# Patient Record
Sex: Female | Born: 1955
Health system: Southern US, Community
[De-identification: ages and names within clinical notes are randomized; demographics above are authoritative.]

## PROBLEM LIST (undated history)

## (undated) HISTORY — PX: CARPAL TUNNEL RELEASE: SHX101

## (undated) HISTORY — PX: OTHER SURGICAL HISTORY: SHX169

## (undated) HISTORY — PX: FRACTURE SURGERY: SHX138

---

## 1999-02-14 ENCOUNTER — Other Ambulatory Visit: Admission: RE | Admit: 1999-02-14 | Discharge: 1999-02-14 | Payer: Self-pay | Admitting: *Deleted

## 2000-03-26 ENCOUNTER — Other Ambulatory Visit: Admission: RE | Admit: 2000-03-26 | Discharge: 2000-03-26 | Payer: Self-pay | Admitting: *Deleted

## 2003-04-26 ENCOUNTER — Other Ambulatory Visit: Admission: RE | Admit: 2003-04-26 | Discharge: 2003-04-26 | Payer: Self-pay | Admitting: *Deleted

## 2006-10-08 ENCOUNTER — Other Ambulatory Visit: Admission: RE | Admit: 2006-10-08 | Discharge: 2006-10-08 | Payer: Self-pay | Admitting: Family Medicine

## 2008-04-07 ENCOUNTER — Other Ambulatory Visit: Admission: RE | Admit: 2008-04-07 | Discharge: 2008-04-07 | Payer: Self-pay | Admitting: Family Medicine

## 2008-05-11 ENCOUNTER — Ambulatory Visit (HOSPITAL_BASED_OUTPATIENT_CLINIC_OR_DEPARTMENT_OTHER): Admission: RE | Admit: 2008-05-11 | Discharge: 2008-05-11 | Payer: Self-pay | Admitting: Orthopedic Surgery

## 2008-05-29 ENCOUNTER — Encounter: Admission: RE | Admit: 2008-05-29 | Discharge: 2008-05-29 | Payer: Self-pay | Admitting: Family Medicine

## 2008-08-08 ENCOUNTER — Ambulatory Visit (HOSPITAL_BASED_OUTPATIENT_CLINIC_OR_DEPARTMENT_OTHER): Admission: RE | Admit: 2008-08-08 | Discharge: 2008-08-08 | Payer: Self-pay | Admitting: Orthopedic Surgery

## 2009-08-22 ENCOUNTER — Encounter: Admission: RE | Admit: 2009-08-22 | Discharge: 2009-08-22 | Payer: Self-pay | Admitting: General Surgery

## 2009-09-21 ENCOUNTER — Other Ambulatory Visit: Admission: RE | Admit: 2009-09-21 | Discharge: 2009-09-21 | Payer: Self-pay | Admitting: Family Medicine

## 2009-10-04 ENCOUNTER — Encounter: Admission: RE | Admit: 2009-10-04 | Discharge: 2009-10-04 | Payer: Self-pay | Admitting: Family Medicine

## 2010-07-23 NOTE — Op Note (Signed)
NAME:  Tonya Schneider, Tonya Schneider NO.:  [id number]   MEDICAL RECORD NO.:  [id number]          PATIENT TYPE:  AMB   LOCATION:  DSC                          FACILITY:  [facility]   PHYSICIAN:  Cindee Salt, M.D.       DATE OF BIRTH:  1955/07/31   DATE OF PROCEDURE:  08/08/2008  DATE OF DISCHARGE:                               OPERATIVE REPORT   PREOPERATIVE DIAGNOSIS:  Carpal tunnel syndrome, right hand.   POSTOPERATIVE DIAGNOSIS:  Carpal tunnel syndrome, right hand.   OPERATION:  Decompression, right median nerve.   SURGEON:  Cindee Salt, MD   ASSISTANT:  Joaquin Courts, RN   ANESTHESIA:  Forearm-based IV regional.   ANESTHESIOLOGIST:  Janetta Hora. Frederick, MD   HISTORY:  The patient is a 55 year old female with a history of carpal  tunnel syndrome, EMG nerve conductions positive, which has not responded  to conservative treatment.  She has elected to undergo surgical  decompression.  Pre, peri, and postoperative course have been discussed  along with the risks and complications.  She is aware that there is no  guarantee with the surgery; possibility of infection; recurrence of  injury to arteries, nerves, tendons, incomplete relief of symptoms; and  dystrophy.  In the preoperative area, the patient is seen.  The  extremity marked by both the patient and surgeon.  Antibiotic given.   PROCEDURE:  The patient was brought to the operating room where a  forearm-based IV regional anesthetic was carried out without difficulty  under the direction of Dr. Gelene Mink.  She was prepped using ChloraPrep,  supine position, right arm free.  A 3-minute dry time was allowed.  A  time-out taken confirming the patient and procedure.  The arm was then  draped.  A longitudinal incision was made in the palm and carried down  through the subcutaneous tissue.  Bleeders were electrocauterized with  bipolar.  Dissection was carried down to the carpal retinaculum.  The  flexor tendon of the ring little  finger identified.  Retractor was  placed to the ulnar side of the median nerve.  The carpal retinaculum  was incised with sharp dissection.  Right angle and Sewall retractor  were placed between skin and forearm fascia.  The fascia was released  for approximately a centimeter and half proximal to the wrist crease  under direct vision.  Canal was explored.  The area of compression to  the nerve was apparent.  No further lesions were identified.  The wound  was irrigated.  Skin was then closed with interrupted 5-0 Vicryl Rapide  sutures.  A local infiltration was given.  Marcaine 0.25% without  epinephrine 3.5 mL was used.  A sterile compressive dressing and  splint to the wrist was applied with fingers free.  The patient  tolerated the procedure well and was taken to the recovery room for  observation in satisfactory condition.  On deflation of the tourniquet,  all fingers immediately pinked.  She will be discharged home to return  to the [facility] [place] in 1 week, on Vicodin.  ______________________________  Cindee Salt, M.D.     GK/MEDQ  D:  08/08/2008  T:  08/08/2008  Job:  [id number]

## 2010-07-23 NOTE — Op Note (Signed)
NAME:  Tonya Schneider, Tonya Schneider           ACCOUNT NO.:  [id number]   MEDICAL RECORD NO.:  [id number]          PATIENT TYPE:  AMB   LOCATION:  DSC                          FACILITY:  [facility]   PHYSICIAN:  Cindee Salt, M.D.       DATE OF BIRTH:  01/17/1956   DATE OF PROCEDURE:  05/11/2008  DATE OF DISCHARGE:                               OPERATIVE REPORT   PREOPERATIVE DIAGNOSIS:  Carpal tunnel syndrome, left hand.   POSTOPERATIVE DIAGNOSIS:  Carpal tunnel syndrome, left hand.   OPERATION:  Decompression, left median nerve.   SURGEON:  Cindee Salt, MD   ANESTHESIA:  Forearm based IV regional.   ANESTHESIOLOGIST:  Burna Forts, MD   HISTORY:  The patient is a 55 year old female with a history of carpal  tunnel syndrome, EMG nerve conductions positive.  She has undergone  release of her right side.  She is admitted now for release of her left.  She is aware of risks and complications including infection, recurrence  injury to arteries, nerves, tendons, incomplete relief of symptoms, or  dystrophy.  In the preoperative area, the patient is seen.  The  extremity marked by both the patient and surgeon.  Antibiotic given.   PROCEDURE:  The patient was brought to the operating room where forearm  based IV regional anesthetic was carried out without difficulty.  She  was prepped using DuraPrep in supine position, left arm free.  A time-  out was taken.  The longitudinal incision was made in the palm, carried  down through subcutaneous tissue.  Bleeders were electrocauterized,  palmar fascia was split, superficial palmar arch identified, the flexor  tendon to the ring little finger identified to the ulnar side of the  median nerve and carpal retinaculum was incised with sharp dissection.  A right-angle and Sewall retractor were placed between skin and forearm  fascia.  The fascia released for approximately a centimeter and a half  proximal to the wrist crease under direct vision.  Canal was  explored.  No further lesions were identified.  Area compression to the nerve was  apparent.  The wound was irrigated copiously with saline.  The skin  closed with interrupted 5-0 Vicryl Rapide sutures.  A local infiltration  was then given with 0.25% Marcaine without epinephrine 3 mL was used.  Sterile compressive dressing and splint was applied and deflation of the  tourniquet, all fingers immediately pinked.  She was taken to the  recovery room for observation in satisfactory condition.  She will be  discharged home.  To return to the [facility] [place] in 1 week, on  Vicodin.           ______________________________  Cindee Salt, M.D.     GK/MEDQ  D:  05/11/2008  T:  05/12/2008  Job:  [id number]

## 2010-10-09 ENCOUNTER — Other Ambulatory Visit: Payer: Self-pay | Admitting: Family Medicine

## 2010-10-09 DIAGNOSIS — Z1231 Encounter for screening mammogram for malignant neoplasm of breast: Secondary | ICD-10-CM

## 2010-10-17 ENCOUNTER — Ambulatory Visit
Admission: RE | Admit: 2010-10-17 | Discharge: 2010-10-17 | Disposition: A | Discharge status: Home / Self Care | Payer: 59 | Source: Ambulatory Visit | Attending: Family Medicine | Admitting: Family Medicine

## 2010-10-17 DIAGNOSIS — Z1231 Encounter for screening mammogram for malignant neoplasm of breast: Secondary | ICD-10-CM

## 2011-09-17 ENCOUNTER — Other Ambulatory Visit (HOSPITAL_COMMUNITY)
Admission: RE | Admit: 2011-09-17 | Discharge: 2011-09-17 | Disposition: A | Discharge status: Home / Self Care | Payer: 59 | Source: Ambulatory Visit | Attending: Family Medicine | Admitting: Family Medicine

## 2011-09-17 ENCOUNTER — Other Ambulatory Visit: Payer: Self-pay | Admitting: Family Medicine

## 2011-09-17 DIAGNOSIS — Z1159 Encounter for screening for other viral diseases: Secondary | ICD-10-CM | POA: Insufficient documentation

## 2011-09-17 DIAGNOSIS — Z124 Encounter for screening for malignant neoplasm of cervix: Secondary | ICD-10-CM | POA: Insufficient documentation

## 2011-10-13 ENCOUNTER — Other Ambulatory Visit: Payer: Self-pay | Admitting: Family Medicine

## 2011-10-13 DIAGNOSIS — Z1231 Encounter for screening mammogram for malignant neoplasm of breast: Secondary | ICD-10-CM

## 2011-10-28 ENCOUNTER — Other Ambulatory Visit: Payer: Self-pay | Admitting: Family Medicine

## 2011-10-28 ENCOUNTER — Ambulatory Visit
Admission: RE | Admit: 2011-10-28 | Discharge: 2011-10-28 | Disposition: A | Discharge status: Home / Self Care | Payer: 59 | Source: Ambulatory Visit | Attending: Family Medicine | Admitting: Family Medicine

## 2011-10-28 DIAGNOSIS — Z1231 Encounter for screening mammogram for malignant neoplasm of breast: Secondary | ICD-10-CM

## 2012-10-25 ENCOUNTER — Other Ambulatory Visit: Payer: Self-pay

## 2012-10-25 DIAGNOSIS — Z1231 Encounter for screening mammogram for malignant neoplasm of breast: Secondary | ICD-10-CM

## 2012-11-17 ENCOUNTER — Ambulatory Visit
Admission: RE | Admit: 2012-11-17 | Discharge: 2012-11-17 | Disposition: A | Discharge status: Home / Self Care | Payer: 59 | Source: Ambulatory Visit

## 2012-11-17 DIAGNOSIS — Z1231 Encounter for screening mammogram for malignant neoplasm of breast: Secondary | ICD-10-CM

## 2012-11-19 ENCOUNTER — Other Ambulatory Visit: Payer: Self-pay | Admitting: Family Medicine

## 2012-11-19 DIAGNOSIS — R928 Other abnormal and inconclusive findings on diagnostic imaging of breast: Secondary | ICD-10-CM

## 2012-12-02 ENCOUNTER — Ambulatory Visit
Admission: RE | Admit: 2012-12-02 | Discharge: 2012-12-02 | Disposition: A | Discharge status: Home / Self Care | Payer: 59 | Source: Ambulatory Visit | Attending: Family Medicine | Admitting: Family Medicine

## 2012-12-02 DIAGNOSIS — R928 Other abnormal and inconclusive findings on diagnostic imaging of breast: Secondary | ICD-10-CM

## 2013-11-10 ENCOUNTER — Other Ambulatory Visit: Payer: Self-pay | Admitting: Family Medicine

## 2013-11-10 DIAGNOSIS — M8580 Other specified disorders of bone density and structure, unspecified site: Secondary | ICD-10-CM

## 2013-11-10 DIAGNOSIS — Z1231 Encounter for screening mammogram for malignant neoplasm of breast: Secondary | ICD-10-CM

## 2013-11-30 ENCOUNTER — Ambulatory Visit
Admission: RE | Admit: 2013-11-30 | Discharge: 2013-11-30 | Disposition: A | Discharge status: Home / Self Care | Payer: 59 | Source: Ambulatory Visit | Attending: Family Medicine | Admitting: Family Medicine

## 2013-11-30 ENCOUNTER — Encounter (INDEPENDENT_AMBULATORY_CARE_PROVIDER_SITE_OTHER): Payer: Self-pay

## 2013-11-30 DIAGNOSIS — Z1231 Encounter for screening mammogram for malignant neoplasm of breast: Secondary | ICD-10-CM

## 2013-11-30 DIAGNOSIS — M8580 Other specified disorders of bone density and structure, unspecified site: Secondary | ICD-10-CM

## 2015-06-27 DIAGNOSIS — F321 Major depressive disorder, single episode, moderate: Secondary | ICD-10-CM | POA: Diagnosis not present

## 2015-07-25 DIAGNOSIS — F321 Major depressive disorder, single episode, moderate: Secondary | ICD-10-CM | POA: Diagnosis not present

## 2015-08-22 DIAGNOSIS — F321 Major depressive disorder, single episode, moderate: Secondary | ICD-10-CM | POA: Diagnosis not present

## 2015-10-03 DIAGNOSIS — F321 Major depressive disorder, single episode, moderate: Secondary | ICD-10-CM | POA: Diagnosis not present

## 2015-11-06 DIAGNOSIS — F321 Major depressive disorder, single episode, moderate: Secondary | ICD-10-CM | POA: Diagnosis not present

## 2015-11-16 ENCOUNTER — Other Ambulatory Visit: Payer: Self-pay | Admitting: Physician Assistant

## 2015-11-16 ENCOUNTER — Other Ambulatory Visit (HOSPITAL_COMMUNITY)
Admission: RE | Admit: 2015-11-16 | Discharge: 2015-11-16 | Disposition: A | Discharge status: Home / Self Care | Payer: BLUE CROSS/BLUE SHIELD | Source: Ambulatory Visit | Attending: Physician Assistant | Admitting: Physician Assistant

## 2015-11-16 DIAGNOSIS — E559 Vitamin D deficiency, unspecified: Secondary | ICD-10-CM | POA: Diagnosis not present

## 2015-11-16 DIAGNOSIS — Z1151 Encounter for screening for human papillomavirus (HPV): Secondary | ICD-10-CM | POA: Diagnosis not present

## 2015-11-16 DIAGNOSIS — Z0000 Encounter for general adult medical examination without abnormal findings: Secondary | ICD-10-CM | POA: Diagnosis not present

## 2015-11-16 DIAGNOSIS — Z124 Encounter for screening for malignant neoplasm of cervix: Secondary | ICD-10-CM | POA: Diagnosis not present

## 2015-11-16 DIAGNOSIS — Z23 Encounter for immunization: Secondary | ICD-10-CM | POA: Diagnosis not present

## 2015-11-17 ENCOUNTER — Other Ambulatory Visit: Payer: Self-pay | Admitting: Physician Assistant

## 2015-11-17 DIAGNOSIS — Z1231 Encounter for screening mammogram for malignant neoplasm of breast: Secondary | ICD-10-CM

## 2015-11-17 DIAGNOSIS — M810 Age-related osteoporosis without current pathological fracture: Secondary | ICD-10-CM

## 2015-11-20 LAB — CYTOLOGY - PAP

## 2015-12-03 ENCOUNTER — Ambulatory Visit
Admission: RE | Admit: 2015-12-03 | Discharge: 2015-12-03 | Disposition: A | Discharge status: Home / Self Care | Payer: BLUE CROSS/BLUE SHIELD | Source: Ambulatory Visit | Attending: Physician Assistant | Admitting: Physician Assistant

## 2015-12-03 ENCOUNTER — Encounter: Payer: Self-pay | Admitting: Radiology

## 2015-12-03 DIAGNOSIS — Z1231 Encounter for screening mammogram for malignant neoplasm of breast: Secondary | ICD-10-CM | POA: Diagnosis not present

## 2015-12-03 DIAGNOSIS — M810 Age-related osteoporosis without current pathological fracture: Secondary | ICD-10-CM

## 2015-12-03 DIAGNOSIS — Z780 Asymptomatic menopausal state: Secondary | ICD-10-CM | POA: Diagnosis not present

## 2015-12-12 DIAGNOSIS — F321 Major depressive disorder, single episode, moderate: Secondary | ICD-10-CM | POA: Diagnosis not present

## 2015-12-17 DIAGNOSIS — Z1211 Encounter for screening for malignant neoplasm of colon: Secondary | ICD-10-CM | POA: Diagnosis not present

## 2016-01-09 DIAGNOSIS — F321 Major depressive disorder, single episode, moderate: Secondary | ICD-10-CM | POA: Diagnosis not present

## 2016-01-29 DIAGNOSIS — K640 First degree hemorrhoids: Secondary | ICD-10-CM | POA: Diagnosis not present

## 2016-01-29 DIAGNOSIS — Z1211 Encounter for screening for malignant neoplasm of colon: Secondary | ICD-10-CM | POA: Diagnosis not present

## 2016-02-13 DIAGNOSIS — F321 Major depressive disorder, single episode, moderate: Secondary | ICD-10-CM | POA: Diagnosis not present

## 2016-03-19 DIAGNOSIS — F321 Major depressive disorder, single episode, moderate: Secondary | ICD-10-CM | POA: Diagnosis not present

## 2016-04-16 DIAGNOSIS — F321 Major depressive disorder, single episode, moderate: Secondary | ICD-10-CM | POA: Diagnosis not present

## 2016-05-21 DIAGNOSIS — F321 Major depressive disorder, single episode, moderate: Secondary | ICD-10-CM | POA: Diagnosis not present

## 2016-06-18 DIAGNOSIS — F321 Major depressive disorder, single episode, moderate: Secondary | ICD-10-CM | POA: Diagnosis not present

## 2016-07-23 DIAGNOSIS — F321 Major depressive disorder, single episode, moderate: Secondary | ICD-10-CM | POA: Diagnosis not present

## 2016-08-20 DIAGNOSIS — F321 Major depressive disorder, single episode, moderate: Secondary | ICD-10-CM | POA: Diagnosis not present

## 2016-09-24 DIAGNOSIS — F321 Major depressive disorder, single episode, moderate: Secondary | ICD-10-CM | POA: Diagnosis not present

## 2016-10-27 DIAGNOSIS — L03211 Cellulitis of face: Secondary | ICD-10-CM | POA: Diagnosis not present

## 2016-10-27 DIAGNOSIS — S0086XA Insect bite (nonvenomous) of other part of head, initial encounter: Secondary | ICD-10-CM | POA: Diagnosis not present

## 2016-10-29 DIAGNOSIS — F321 Major depressive disorder, single episode, moderate: Secondary | ICD-10-CM | POA: Diagnosis not present

## 2016-11-20 DIAGNOSIS — Z13220 Encounter for screening for lipoid disorders: Secondary | ICD-10-CM | POA: Diagnosis not present

## 2016-11-20 DIAGNOSIS — Z23 Encounter for immunization: Secondary | ICD-10-CM | POA: Diagnosis not present

## 2016-11-20 DIAGNOSIS — E559 Vitamin D deficiency, unspecified: Secondary | ICD-10-CM | POA: Diagnosis not present

## 2016-11-20 DIAGNOSIS — Z0000 Encounter for general adult medical examination without abnormal findings: Secondary | ICD-10-CM | POA: Diagnosis not present

## 2016-12-03 ENCOUNTER — Other Ambulatory Visit: Payer: Self-pay | Admitting: Family Medicine

## 2016-12-03 DIAGNOSIS — Z1231 Encounter for screening mammogram for malignant neoplasm of breast: Secondary | ICD-10-CM

## 2016-12-03 DIAGNOSIS — F321 Major depressive disorder, single episode, moderate: Secondary | ICD-10-CM | POA: Diagnosis not present

## 2016-12-19 ENCOUNTER — Ambulatory Visit
Admission: RE | Admit: 2016-12-19 | Discharge: 2016-12-19 | Disposition: A | Discharge status: Home / Self Care | Payer: BLUE CROSS/BLUE SHIELD | Source: Ambulatory Visit | Attending: Family Medicine | Admitting: Family Medicine

## 2016-12-19 DIAGNOSIS — Z1231 Encounter for screening mammogram for malignant neoplasm of breast: Secondary | ICD-10-CM

## 2016-12-31 DIAGNOSIS — F321 Major depressive disorder, single episode, moderate: Secondary | ICD-10-CM | POA: Diagnosis not present

## 2017-02-04 DIAGNOSIS — F321 Major depressive disorder, single episode, moderate: Secondary | ICD-10-CM | POA: Diagnosis not present

## 2017-03-11 DIAGNOSIS — F321 Major depressive disorder, single episode, moderate: Secondary | ICD-10-CM | POA: Diagnosis not present

## 2017-04-22 DIAGNOSIS — F321 Major depressive disorder, single episode, moderate: Secondary | ICD-10-CM | POA: Diagnosis not present

## 2017-04-27 DIAGNOSIS — Z808 Family history of malignant neoplasm of other organs or systems: Secondary | ICD-10-CM | POA: Diagnosis not present

## 2017-04-27 DIAGNOSIS — L821 Other seborrheic keratosis: Secondary | ICD-10-CM | POA: Diagnosis not present

## 2017-04-27 DIAGNOSIS — D1801 Hemangioma of skin and subcutaneous tissue: Secondary | ICD-10-CM | POA: Diagnosis not present

## 2017-05-20 DIAGNOSIS — F321 Major depressive disorder, single episode, moderate: Secondary | ICD-10-CM | POA: Diagnosis not present

## 2017-06-17 DIAGNOSIS — F321 Major depressive disorder, single episode, moderate: Secondary | ICD-10-CM | POA: Diagnosis not present

## 2017-07-15 DIAGNOSIS — F321 Major depressive disorder, single episode, moderate: Secondary | ICD-10-CM | POA: Diagnosis not present

## 2017-08-12 DIAGNOSIS — F321 Major depressive disorder, single episode, moderate: Secondary | ICD-10-CM | POA: Diagnosis not present

## 2017-09-16 DIAGNOSIS — F321 Major depressive disorder, single episode, moderate: Secondary | ICD-10-CM | POA: Diagnosis not present

## 2017-09-29 DIAGNOSIS — S299XXA Unspecified injury of thorax, initial encounter: Secondary | ICD-10-CM | POA: Diagnosis not present

## 2017-09-29 DIAGNOSIS — D72829 Elevated white blood cell count, unspecified: Secondary | ICD-10-CM | POA: Diagnosis not present

## 2017-09-29 DIAGNOSIS — S72011A Unspecified intracapsular fracture of right femur, initial encounter for closed fracture: Secondary | ICD-10-CM | POA: Diagnosis not present

## 2017-09-29 DIAGNOSIS — R269 Unspecified abnormalities of gait and mobility: Secondary | ICD-10-CM | POA: Diagnosis not present

## 2017-09-29 DIAGNOSIS — T148XXA Other injury of unspecified body region, initial encounter: Secondary | ICD-10-CM | POA: Diagnosis not present

## 2017-09-29 DIAGNOSIS — Z8249 Family history of ischemic heart disease and other diseases of the circulatory system: Secondary | ICD-10-CM | POA: Diagnosis not present

## 2017-09-29 DIAGNOSIS — Z9181 History of falling: Secondary | ICD-10-CM | POA: Diagnosis not present

## 2017-09-29 DIAGNOSIS — Z96642 Presence of left artificial hip joint: Secondary | ICD-10-CM | POA: Diagnosis not present

## 2017-09-29 DIAGNOSIS — M25551 Pain in right hip: Secondary | ICD-10-CM | POA: Diagnosis not present

## 2017-09-29 DIAGNOSIS — S72001A Fracture of unspecified part of neck of right femur, initial encounter for closed fracture: Secondary | ICD-10-CM | POA: Diagnosis not present

## 2017-09-30 DIAGNOSIS — S72011A Unspecified intracapsular fracture of right femur, initial encounter for closed fracture: Secondary | ICD-10-CM | POA: Diagnosis not present

## 2017-09-30 DIAGNOSIS — Z9181 History of falling: Secondary | ICD-10-CM | POA: Diagnosis not present

## 2017-09-30 DIAGNOSIS — S72001A Fracture of unspecified part of neck of right femur, initial encounter for closed fracture: Secondary | ICD-10-CM | POA: Diagnosis not present

## 2017-09-30 DIAGNOSIS — Z96642 Presence of left artificial hip joint: Secondary | ICD-10-CM | POA: Diagnosis not present

## 2017-09-30 DIAGNOSIS — D72829 Elevated white blood cell count, unspecified: Secondary | ICD-10-CM | POA: Diagnosis not present

## 2017-10-01 DIAGNOSIS — S72011A Unspecified intracapsular fracture of right femur, initial encounter for closed fracture: Secondary | ICD-10-CM | POA: Diagnosis not present

## 2017-10-01 DIAGNOSIS — D72829 Elevated white blood cell count, unspecified: Secondary | ICD-10-CM | POA: Diagnosis not present

## 2017-10-01 DIAGNOSIS — Z9181 History of falling: Secondary | ICD-10-CM | POA: Diagnosis not present

## 2017-10-01 DIAGNOSIS — S72001A Fracture of unspecified part of neck of right femur, initial encounter for closed fracture: Secondary | ICD-10-CM | POA: Diagnosis not present

## 2017-10-01 DIAGNOSIS — Z96642 Presence of left artificial hip joint: Secondary | ICD-10-CM | POA: Diagnosis not present

## 2017-10-02 DIAGNOSIS — Z96641 Presence of right artificial hip joint: Secondary | ICD-10-CM | POA: Diagnosis not present

## 2017-10-02 DIAGNOSIS — W19XXXA Unspecified fall, initial encounter: Secondary | ICD-10-CM | POA: Diagnosis not present

## 2017-10-02 DIAGNOSIS — D62 Acute posthemorrhagic anemia: Secondary | ICD-10-CM | POA: Diagnosis not present

## 2017-10-02 DIAGNOSIS — Z9181 History of falling: Secondary | ICD-10-CM | POA: Diagnosis not present

## 2017-10-02 DIAGNOSIS — D72829 Elevated white blood cell count, unspecified: Secondary | ICD-10-CM | POA: Diagnosis not present

## 2017-10-02 DIAGNOSIS — S72011D Unspecified intracapsular fracture of right femur, subsequent encounter for closed fracture with routine healing: Secondary | ICD-10-CM | POA: Diagnosis not present

## 2017-10-02 DIAGNOSIS — Z96642 Presence of left artificial hip joint: Secondary | ICD-10-CM | POA: Diagnosis not present

## 2017-10-02 DIAGNOSIS — Z471 Aftercare following joint replacement surgery: Secondary | ICD-10-CM | POA: Diagnosis not present

## 2017-10-02 DIAGNOSIS — D649 Anemia, unspecified: Secondary | ICD-10-CM | POA: Diagnosis not present

## 2017-10-02 DIAGNOSIS — S72001A Fracture of unspecified part of neck of right femur, initial encounter for closed fracture: Secondary | ICD-10-CM | POA: Diagnosis not present

## 2017-10-02 DIAGNOSIS — G4700 Insomnia, unspecified: Secondary | ICD-10-CM | POA: Diagnosis not present

## 2017-10-02 DIAGNOSIS — Z7409 Other reduced mobility: Secondary | ICD-10-CM | POA: Diagnosis not present

## 2017-10-02 DIAGNOSIS — R739 Hyperglycemia, unspecified: Secondary | ICD-10-CM | POA: Diagnosis not present

## 2017-10-02 DIAGNOSIS — Z7982 Long term (current) use of aspirin: Secondary | ICD-10-CM | POA: Diagnosis not present

## 2017-10-03 DIAGNOSIS — D72829 Elevated white blood cell count, unspecified: Secondary | ICD-10-CM | POA: Diagnosis not present

## 2017-10-03 DIAGNOSIS — Z7982 Long term (current) use of aspirin: Secondary | ICD-10-CM | POA: Diagnosis not present

## 2017-10-03 DIAGNOSIS — R739 Hyperglycemia, unspecified: Secondary | ICD-10-CM | POA: Diagnosis not present

## 2017-10-03 DIAGNOSIS — Z96642 Presence of left artificial hip joint: Secondary | ICD-10-CM | POA: Diagnosis not present

## 2017-10-03 DIAGNOSIS — Z7409 Other reduced mobility: Secondary | ICD-10-CM | POA: Diagnosis not present

## 2017-10-04 DIAGNOSIS — Z96642 Presence of left artificial hip joint: Secondary | ICD-10-CM | POA: Diagnosis not present

## 2017-10-04 DIAGNOSIS — Z7982 Long term (current) use of aspirin: Secondary | ICD-10-CM | POA: Diagnosis not present

## 2017-10-04 DIAGNOSIS — S72011D Unspecified intracapsular fracture of right femur, subsequent encounter for closed fracture with routine healing: Secondary | ICD-10-CM | POA: Diagnosis not present

## 2017-10-04 DIAGNOSIS — R739 Hyperglycemia, unspecified: Secondary | ICD-10-CM | POA: Diagnosis not present

## 2017-10-04 DIAGNOSIS — Z7409 Other reduced mobility: Secondary | ICD-10-CM | POA: Diagnosis not present

## 2017-10-05 DIAGNOSIS — D62 Acute posthemorrhagic anemia: Secondary | ICD-10-CM | POA: Diagnosis not present

## 2017-10-05 DIAGNOSIS — Z7409 Other reduced mobility: Secondary | ICD-10-CM | POA: Diagnosis not present

## 2017-10-05 DIAGNOSIS — G4700 Insomnia, unspecified: Secondary | ICD-10-CM | POA: Diagnosis not present

## 2017-10-05 DIAGNOSIS — Z96642 Presence of left artificial hip joint: Secondary | ICD-10-CM | POA: Diagnosis not present

## 2017-10-06 DIAGNOSIS — Z7409 Other reduced mobility: Secondary | ICD-10-CM | POA: Diagnosis not present

## 2017-10-06 DIAGNOSIS — Z96642 Presence of left artificial hip joint: Secondary | ICD-10-CM | POA: Diagnosis not present

## 2017-10-06 DIAGNOSIS — D62 Acute posthemorrhagic anemia: Secondary | ICD-10-CM | POA: Diagnosis not present

## 2017-10-06 DIAGNOSIS — G4700 Insomnia, unspecified: Secondary | ICD-10-CM | POA: Diagnosis not present

## 2017-10-07 DIAGNOSIS — Z96642 Presence of left artificial hip joint: Secondary | ICD-10-CM | POA: Diagnosis not present

## 2017-10-07 DIAGNOSIS — Z7409 Other reduced mobility: Secondary | ICD-10-CM | POA: Diagnosis not present

## 2017-10-20 DIAGNOSIS — M5136 Other intervertebral disc degeneration, lumbar region: Secondary | ICD-10-CM | POA: Diagnosis not present

## 2017-10-20 DIAGNOSIS — Z96641 Presence of right artificial hip joint: Secondary | ICD-10-CM | POA: Diagnosis not present

## 2017-10-20 DIAGNOSIS — Z471 Aftercare following joint replacement surgery: Secondary | ICD-10-CM | POA: Diagnosis not present

## 2017-10-20 DIAGNOSIS — M25551 Pain in right hip: Secondary | ICD-10-CM | POA: Diagnosis not present

## 2017-10-21 DIAGNOSIS — R29898 Other symptoms and signs involving the musculoskeletal system: Secondary | ICD-10-CM | POA: Diagnosis not present

## 2017-10-21 DIAGNOSIS — Z96642 Presence of left artificial hip joint: Secondary | ICD-10-CM | POA: Diagnosis not present

## 2017-10-21 DIAGNOSIS — R269 Unspecified abnormalities of gait and mobility: Secondary | ICD-10-CM | POA: Diagnosis not present

## 2017-11-10 DIAGNOSIS — Z96641 Presence of right artificial hip joint: Secondary | ICD-10-CM | POA: Diagnosis not present

## 2017-11-10 DIAGNOSIS — Z471 Aftercare following joint replacement surgery: Secondary | ICD-10-CM | POA: Diagnosis not present

## 2017-11-10 DIAGNOSIS — M5136 Other intervertebral disc degeneration, lumbar region: Secondary | ICD-10-CM | POA: Diagnosis not present

## 2017-11-12 DIAGNOSIS — R29898 Other symptoms and signs involving the musculoskeletal system: Secondary | ICD-10-CM | POA: Diagnosis not present

## 2017-11-12 DIAGNOSIS — M62551 Muscle wasting and atrophy, not elsewhere classified, right thigh: Secondary | ICD-10-CM | POA: Diagnosis not present

## 2017-11-12 DIAGNOSIS — R2689 Other abnormalities of gait and mobility: Secondary | ICD-10-CM | POA: Diagnosis not present

## 2017-11-12 DIAGNOSIS — Z4789 Encounter for other orthopedic aftercare: Secondary | ICD-10-CM | POA: Diagnosis not present

## 2017-11-19 DIAGNOSIS — F321 Major depressive disorder, single episode, moderate: Secondary | ICD-10-CM | POA: Diagnosis not present

## 2017-11-20 DIAGNOSIS — Z4789 Encounter for other orthopedic aftercare: Secondary | ICD-10-CM | POA: Diagnosis not present

## 2017-11-20 DIAGNOSIS — R29898 Other symptoms and signs involving the musculoskeletal system: Secondary | ICD-10-CM | POA: Diagnosis not present

## 2017-11-20 DIAGNOSIS — M62551 Muscle wasting and atrophy, not elsewhere classified, right thigh: Secondary | ICD-10-CM | POA: Diagnosis not present

## 2017-11-20 DIAGNOSIS — R2689 Other abnormalities of gait and mobility: Secondary | ICD-10-CM | POA: Diagnosis not present

## 2017-11-26 DIAGNOSIS — R29898 Other symptoms and signs involving the musculoskeletal system: Secondary | ICD-10-CM | POA: Diagnosis not present

## 2017-11-26 DIAGNOSIS — R2689 Other abnormalities of gait and mobility: Secondary | ICD-10-CM | POA: Diagnosis not present

## 2017-11-26 DIAGNOSIS — M62551 Muscle wasting and atrophy, not elsewhere classified, right thigh: Secondary | ICD-10-CM | POA: Diagnosis not present

## 2017-11-26 DIAGNOSIS — Z4789 Encounter for other orthopedic aftercare: Secondary | ICD-10-CM | POA: Diagnosis not present

## 2017-12-03 DIAGNOSIS — M62551 Muscle wasting and atrophy, not elsewhere classified, right thigh: Secondary | ICD-10-CM | POA: Diagnosis not present

## 2017-12-03 DIAGNOSIS — Z4789 Encounter for other orthopedic aftercare: Secondary | ICD-10-CM | POA: Diagnosis not present

## 2017-12-03 DIAGNOSIS — R29898 Other symptoms and signs involving the musculoskeletal system: Secondary | ICD-10-CM | POA: Diagnosis not present

## 2017-12-03 DIAGNOSIS — R2689 Other abnormalities of gait and mobility: Secondary | ICD-10-CM | POA: Diagnosis not present

## 2017-12-07 DIAGNOSIS — H5213 Myopia, bilateral: Secondary | ICD-10-CM | POA: Diagnosis not present

## 2017-12-10 DIAGNOSIS — Z4789 Encounter for other orthopedic aftercare: Secondary | ICD-10-CM | POA: Diagnosis not present

## 2017-12-10 DIAGNOSIS — R2689 Other abnormalities of gait and mobility: Secondary | ICD-10-CM | POA: Diagnosis not present

## 2017-12-10 DIAGNOSIS — M62551 Muscle wasting and atrophy, not elsewhere classified, right thigh: Secondary | ICD-10-CM | POA: Diagnosis not present

## 2017-12-10 DIAGNOSIS — R29898 Other symptoms and signs involving the musculoskeletal system: Secondary | ICD-10-CM | POA: Diagnosis not present

## 2017-12-10 DIAGNOSIS — Z96642 Presence of left artificial hip joint: Secondary | ICD-10-CM | POA: Diagnosis not present

## 2017-12-14 DIAGNOSIS — Z0000 Encounter for general adult medical examination without abnormal findings: Secondary | ICD-10-CM | POA: Diagnosis not present

## 2017-12-14 DIAGNOSIS — Z13220 Encounter for screening for lipoid disorders: Secondary | ICD-10-CM | POA: Diagnosis not present

## 2017-12-14 DIAGNOSIS — Z1159 Encounter for screening for other viral diseases: Secondary | ICD-10-CM | POA: Diagnosis not present

## 2017-12-14 DIAGNOSIS — Z23 Encounter for immunization: Secondary | ICD-10-CM | POA: Diagnosis not present

## 2017-12-14 DIAGNOSIS — Z130 Encounter for screening for diseases of the blood and blood-forming organs and certain disorders involving the immune mechanism: Secondary | ICD-10-CM | POA: Diagnosis not present

## 2017-12-14 DIAGNOSIS — Z1211 Encounter for screening for malignant neoplasm of colon: Secondary | ICD-10-CM | POA: Diagnosis not present

## 2017-12-14 DIAGNOSIS — R7301 Impaired fasting glucose: Secondary | ICD-10-CM | POA: Diagnosis not present

## 2017-12-14 DIAGNOSIS — E559 Vitamin D deficiency, unspecified: Secondary | ICD-10-CM | POA: Diagnosis not present

## 2017-12-15 ENCOUNTER — Other Ambulatory Visit: Payer: Self-pay | Admitting: Family Medicine

## 2017-12-15 DIAGNOSIS — M810 Age-related osteoporosis without current pathological fracture: Secondary | ICD-10-CM

## 2017-12-16 ENCOUNTER — Other Ambulatory Visit: Payer: Self-pay | Admitting: Family Medicine

## 2017-12-16 DIAGNOSIS — Z1231 Encounter for screening mammogram for malignant neoplasm of breast: Secondary | ICD-10-CM

## 2017-12-17 DIAGNOSIS — Z96642 Presence of left artificial hip joint: Secondary | ICD-10-CM | POA: Diagnosis not present

## 2017-12-17 DIAGNOSIS — Z4789 Encounter for other orthopedic aftercare: Secondary | ICD-10-CM | POA: Diagnosis not present

## 2017-12-17 DIAGNOSIS — M62551 Muscle wasting and atrophy, not elsewhere classified, right thigh: Secondary | ICD-10-CM | POA: Diagnosis not present

## 2017-12-17 DIAGNOSIS — R29898 Other symptoms and signs involving the musculoskeletal system: Secondary | ICD-10-CM | POA: Diagnosis not present

## 2017-12-17 DIAGNOSIS — R2689 Other abnormalities of gait and mobility: Secondary | ICD-10-CM | POA: Diagnosis not present

## 2017-12-23 DIAGNOSIS — F321 Major depressive disorder, single episode, moderate: Secondary | ICD-10-CM | POA: Diagnosis not present

## 2017-12-24 DIAGNOSIS — R2689 Other abnormalities of gait and mobility: Secondary | ICD-10-CM | POA: Diagnosis not present

## 2017-12-24 DIAGNOSIS — Z4789 Encounter for other orthopedic aftercare: Secondary | ICD-10-CM | POA: Diagnosis not present

## 2017-12-24 DIAGNOSIS — M62551 Muscle wasting and atrophy, not elsewhere classified, right thigh: Secondary | ICD-10-CM | POA: Diagnosis not present

## 2017-12-24 DIAGNOSIS — R29898 Other symptoms and signs involving the musculoskeletal system: Secondary | ICD-10-CM | POA: Diagnosis not present

## 2017-12-24 DIAGNOSIS — Z96642 Presence of left artificial hip joint: Secondary | ICD-10-CM | POA: Diagnosis not present

## 2017-12-31 DIAGNOSIS — R29898 Other symptoms and signs involving the musculoskeletal system: Secondary | ICD-10-CM | POA: Diagnosis not present

## 2017-12-31 DIAGNOSIS — R2689 Other abnormalities of gait and mobility: Secondary | ICD-10-CM | POA: Diagnosis not present

## 2017-12-31 DIAGNOSIS — Z96642 Presence of left artificial hip joint: Secondary | ICD-10-CM | POA: Diagnosis not present

## 2017-12-31 DIAGNOSIS — Z4789 Encounter for other orthopedic aftercare: Secondary | ICD-10-CM | POA: Diagnosis not present

## 2017-12-31 DIAGNOSIS — M62551 Muscle wasting and atrophy, not elsewhere classified, right thigh: Secondary | ICD-10-CM | POA: Diagnosis not present

## 2018-01-07 DIAGNOSIS — R2689 Other abnormalities of gait and mobility: Secondary | ICD-10-CM | POA: Diagnosis not present

## 2018-01-07 DIAGNOSIS — R29898 Other symptoms and signs involving the musculoskeletal system: Secondary | ICD-10-CM | POA: Diagnosis not present

## 2018-01-07 DIAGNOSIS — Z4789 Encounter for other orthopedic aftercare: Secondary | ICD-10-CM | POA: Diagnosis not present

## 2018-01-07 DIAGNOSIS — M62551 Muscle wasting and atrophy, not elsewhere classified, right thigh: Secondary | ICD-10-CM | POA: Diagnosis not present

## 2018-01-07 DIAGNOSIS — Z96642 Presence of left artificial hip joint: Secondary | ICD-10-CM | POA: Diagnosis not present

## 2018-01-14 DIAGNOSIS — Z96642 Presence of left artificial hip joint: Secondary | ICD-10-CM | POA: Diagnosis not present

## 2018-01-14 DIAGNOSIS — R29898 Other symptoms and signs involving the musculoskeletal system: Secondary | ICD-10-CM | POA: Diagnosis not present

## 2018-01-14 DIAGNOSIS — Z4789 Encounter for other orthopedic aftercare: Secondary | ICD-10-CM | POA: Diagnosis not present

## 2018-01-14 DIAGNOSIS — M62551 Muscle wasting and atrophy, not elsewhere classified, right thigh: Secondary | ICD-10-CM | POA: Diagnosis not present

## 2018-01-14 DIAGNOSIS — R2689 Other abnormalities of gait and mobility: Secondary | ICD-10-CM | POA: Diagnosis not present

## 2018-01-20 DIAGNOSIS — F321 Major depressive disorder, single episode, moderate: Secondary | ICD-10-CM | POA: Diagnosis not present

## 2018-01-21 DIAGNOSIS — R29898 Other symptoms and signs involving the musculoskeletal system: Secondary | ICD-10-CM | POA: Diagnosis not present

## 2018-01-21 DIAGNOSIS — R2689 Other abnormalities of gait and mobility: Secondary | ICD-10-CM | POA: Diagnosis not present

## 2018-01-21 DIAGNOSIS — Z4789 Encounter for other orthopedic aftercare: Secondary | ICD-10-CM | POA: Diagnosis not present

## 2018-01-21 DIAGNOSIS — Z96642 Presence of left artificial hip joint: Secondary | ICD-10-CM | POA: Diagnosis not present

## 2018-01-21 DIAGNOSIS — M62551 Muscle wasting and atrophy, not elsewhere classified, right thigh: Secondary | ICD-10-CM | POA: Diagnosis not present

## 2018-02-17 ENCOUNTER — Ambulatory Visit
Admission: RE | Admit: 2018-02-17 | Discharge: 2018-02-17 | Disposition: A | Discharge status: Home / Self Care | Payer: BLUE CROSS/BLUE SHIELD | Source: Ambulatory Visit | Attending: Family Medicine | Admitting: Family Medicine

## 2018-02-17 DIAGNOSIS — Z780 Asymptomatic menopausal state: Secondary | ICD-10-CM | POA: Diagnosis not present

## 2018-02-17 DIAGNOSIS — M810 Age-related osteoporosis without current pathological fracture: Secondary | ICD-10-CM

## 2018-02-17 DIAGNOSIS — M85852 Other specified disorders of bone density and structure, left thigh: Secondary | ICD-10-CM | POA: Diagnosis not present

## 2018-02-17 DIAGNOSIS — Z1231 Encounter for screening mammogram for malignant neoplasm of breast: Secondary | ICD-10-CM | POA: Diagnosis not present

## 2018-02-17 DIAGNOSIS — F321 Major depressive disorder, single episode, moderate: Secondary | ICD-10-CM | POA: Diagnosis not present

## 2018-03-17 DIAGNOSIS — F321 Major depressive disorder, single episode, moderate: Secondary | ICD-10-CM | POA: Diagnosis not present

## 2018-04-14 DIAGNOSIS — F321 Major depressive disorder, single episode, moderate: Secondary | ICD-10-CM | POA: Diagnosis not present

## 2018-05-12 DIAGNOSIS — M654 Radial styloid tenosynovitis [de Quervain]: Secondary | ICD-10-CM | POA: Diagnosis not present

## 2018-05-12 DIAGNOSIS — M810 Age-related osteoporosis without current pathological fracture: Secondary | ICD-10-CM | POA: Diagnosis not present

## 2018-07-12 DIAGNOSIS — H43811 Vitreous degeneration, right eye: Secondary | ICD-10-CM | POA: Diagnosis not present

## 2018-07-12 DIAGNOSIS — H43391 Other vitreous opacities, right eye: Secondary | ICD-10-CM | POA: Diagnosis not present

## 2018-12-24 ENCOUNTER — Other Ambulatory Visit (HOSPITAL_COMMUNITY)
Admission: RE | Admit: 2018-12-24 | Discharge: 2018-12-24 | Disposition: A | Discharge status: Home / Self Care | Payer: Medicare HMO | Source: Ambulatory Visit | Attending: Family Medicine | Admitting: Family Medicine

## 2018-12-24 ENCOUNTER — Other Ambulatory Visit: Payer: Self-pay | Admitting: Family Medicine

## 2018-12-24 DIAGNOSIS — E559 Vitamin D deficiency, unspecified: Secondary | ICD-10-CM | POA: Diagnosis not present

## 2018-12-24 DIAGNOSIS — Z136 Encounter for screening for cardiovascular disorders: Secondary | ICD-10-CM | POA: Diagnosis not present

## 2018-12-24 DIAGNOSIS — Z13220 Encounter for screening for lipoid disorders: Secondary | ICD-10-CM | POA: Diagnosis not present

## 2018-12-24 DIAGNOSIS — R7301 Impaired fasting glucose: Secondary | ICD-10-CM | POA: Diagnosis not present

## 2018-12-24 DIAGNOSIS — M810 Age-related osteoporosis without current pathological fracture: Secondary | ICD-10-CM | POA: Diagnosis not present

## 2018-12-24 DIAGNOSIS — Z124 Encounter for screening for malignant neoplasm of cervix: Secondary | ICD-10-CM | POA: Insufficient documentation

## 2018-12-24 DIAGNOSIS — Z23 Encounter for immunization: Secondary | ICD-10-CM | POA: Diagnosis not present

## 2018-12-24 DIAGNOSIS — Z0000 Encounter for general adult medical examination without abnormal findings: Secondary | ICD-10-CM | POA: Diagnosis not present

## 2018-12-24 DIAGNOSIS — L821 Other seborrheic keratosis: Secondary | ICD-10-CM | POA: Diagnosis not present

## 2018-12-30 LAB — CYTOLOGY - PAP
Comment: NEGATIVE
Diagnosis: NEGATIVE
High risk HPV: NEGATIVE

## 2020-01-06 DIAGNOSIS — E559 Vitamin D deficiency, unspecified: Secondary | ICD-10-CM | POA: Diagnosis not present

## 2020-01-06 DIAGNOSIS — R7301 Impaired fasting glucose: Secondary | ICD-10-CM | POA: Diagnosis not present

## 2020-01-06 DIAGNOSIS — Z23 Encounter for immunization: Secondary | ICD-10-CM | POA: Diagnosis not present

## 2020-01-06 DIAGNOSIS — Z136 Encounter for screening for cardiovascular disorders: Secondary | ICD-10-CM | POA: Diagnosis not present

## 2020-01-06 DIAGNOSIS — M810 Age-related osteoporosis without current pathological fracture: Secondary | ICD-10-CM | POA: Diagnosis not present

## 2020-01-06 DIAGNOSIS — Z13220 Encounter for screening for lipoid disorders: Secondary | ICD-10-CM | POA: Diagnosis not present

## 2020-01-06 DIAGNOSIS — Z0000 Encounter for general adult medical examination without abnormal findings: Secondary | ICD-10-CM | POA: Diagnosis not present

## 2020-01-10 IMAGING — MG DIGITAL SCREENING BILATERAL MAMMOGRAM WITH CAD
4 series · 4 of 4 positions shown · non-contrast
Comparison: Previous exam(s).

CLINICAL DATA: Screening.

EXAM:
DIGITAL SCREENING BILATERAL MAMMOGRAM WITH CAD

[L CC]
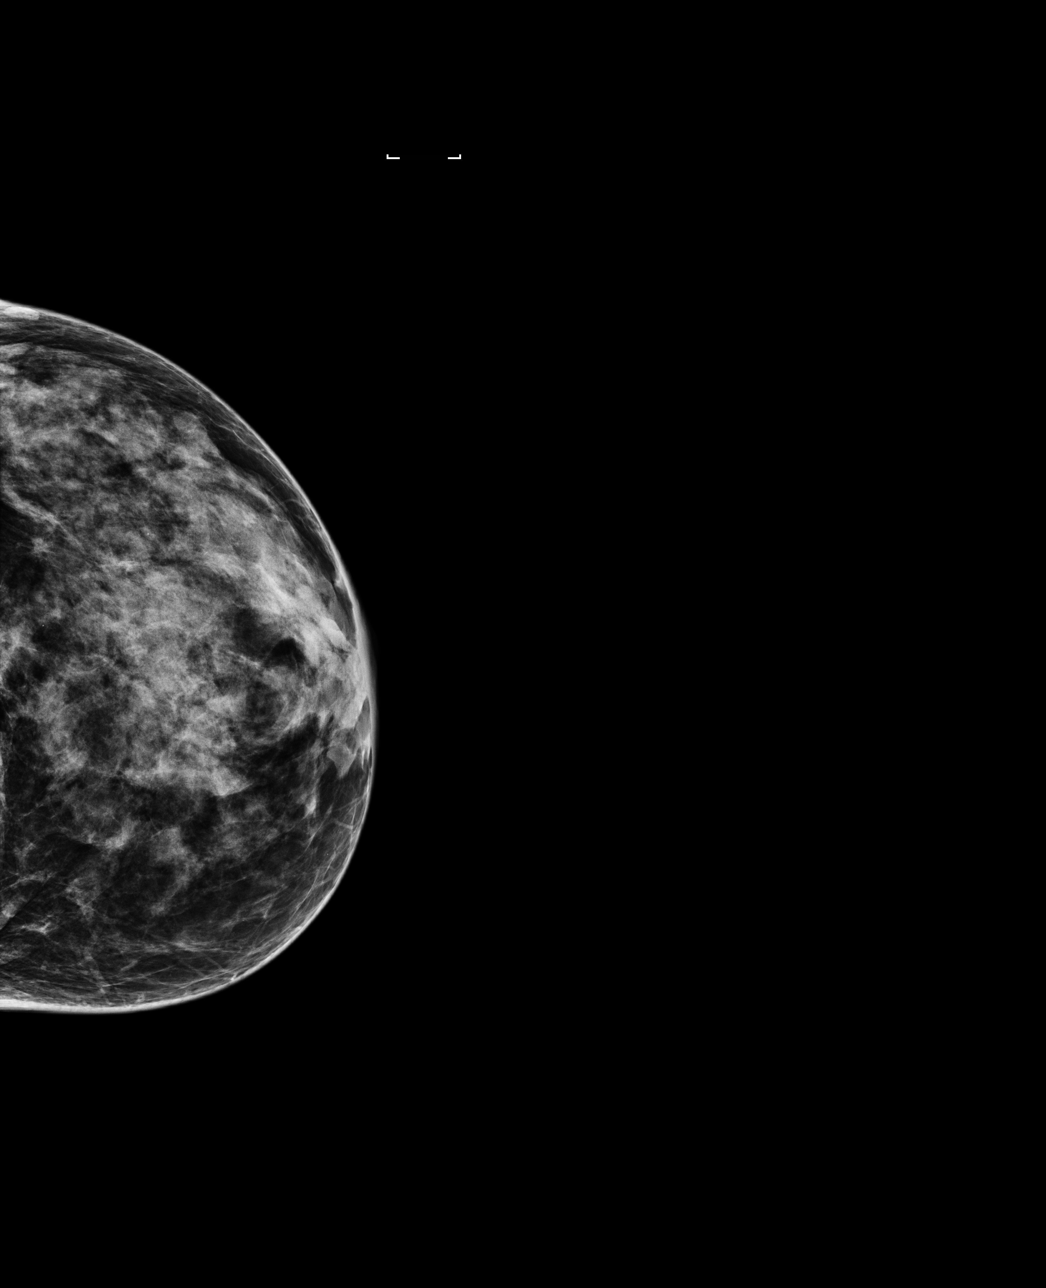

[R CC]
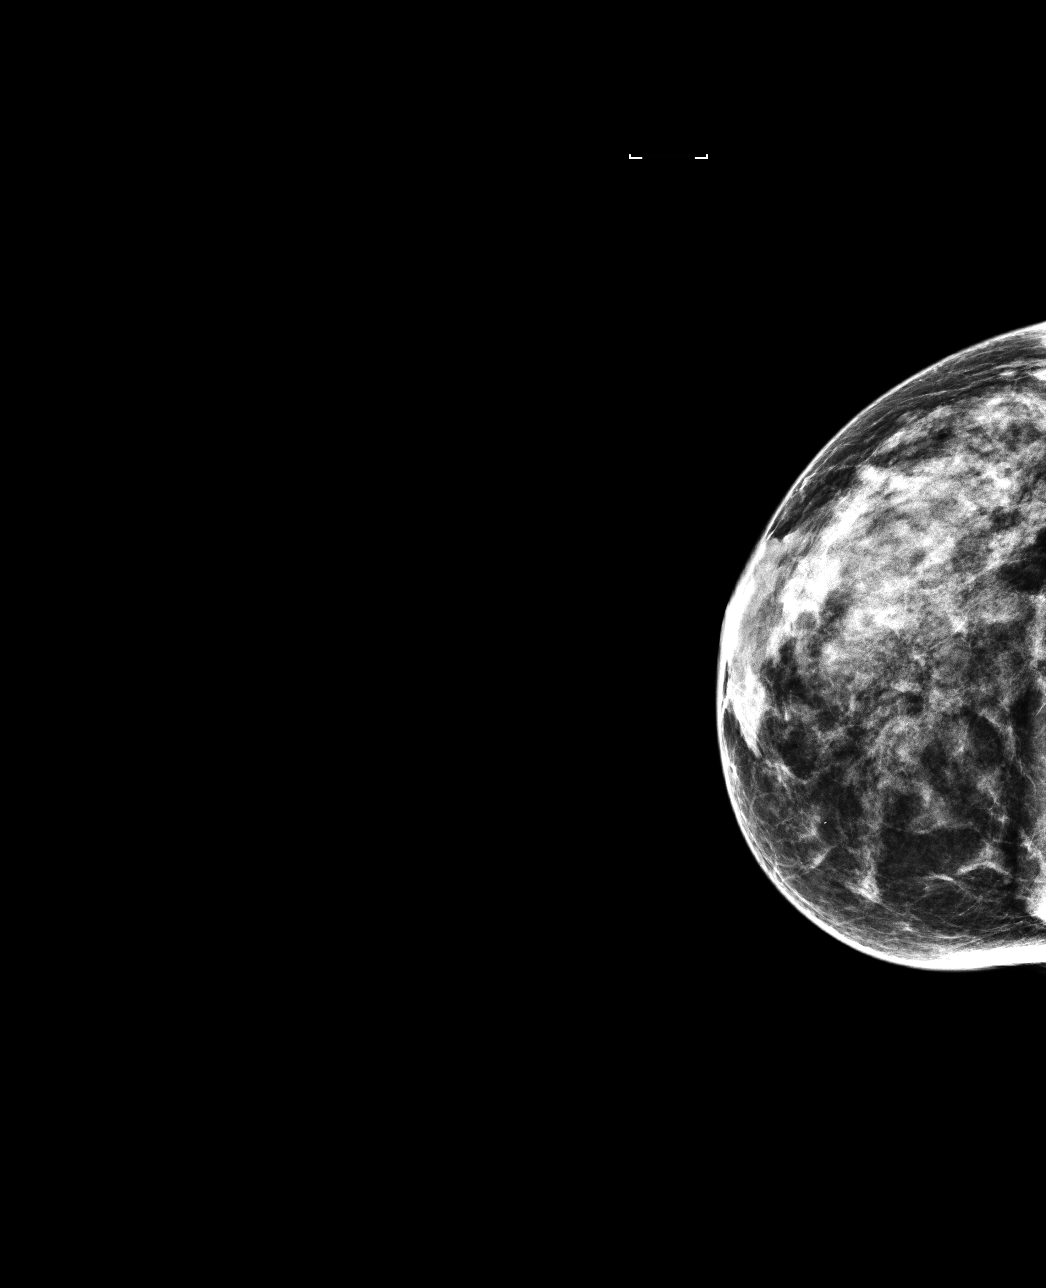

[R MLO]
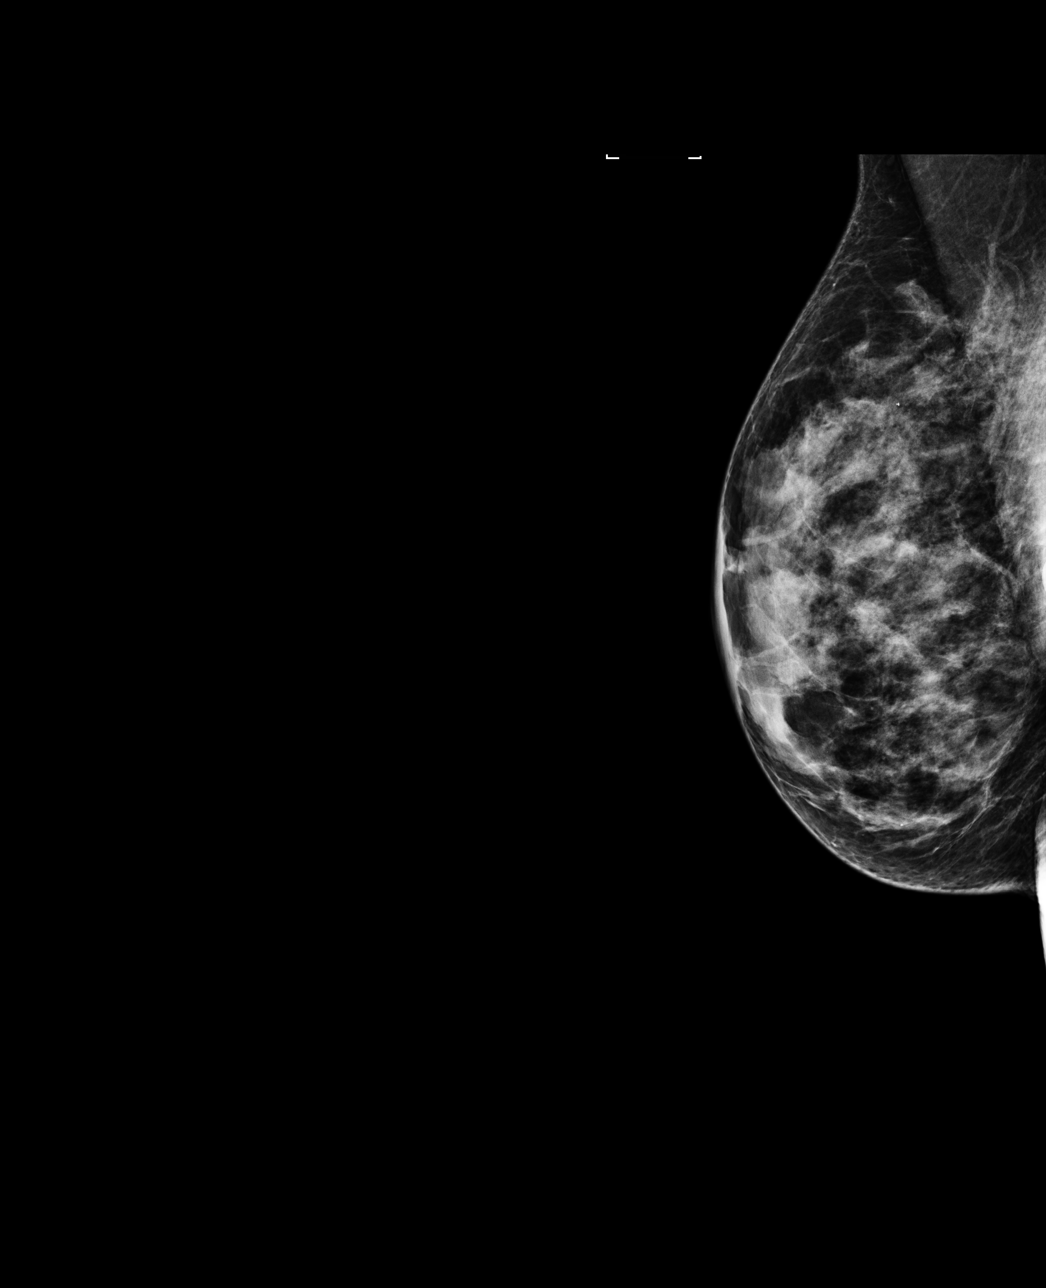

[L MLO]
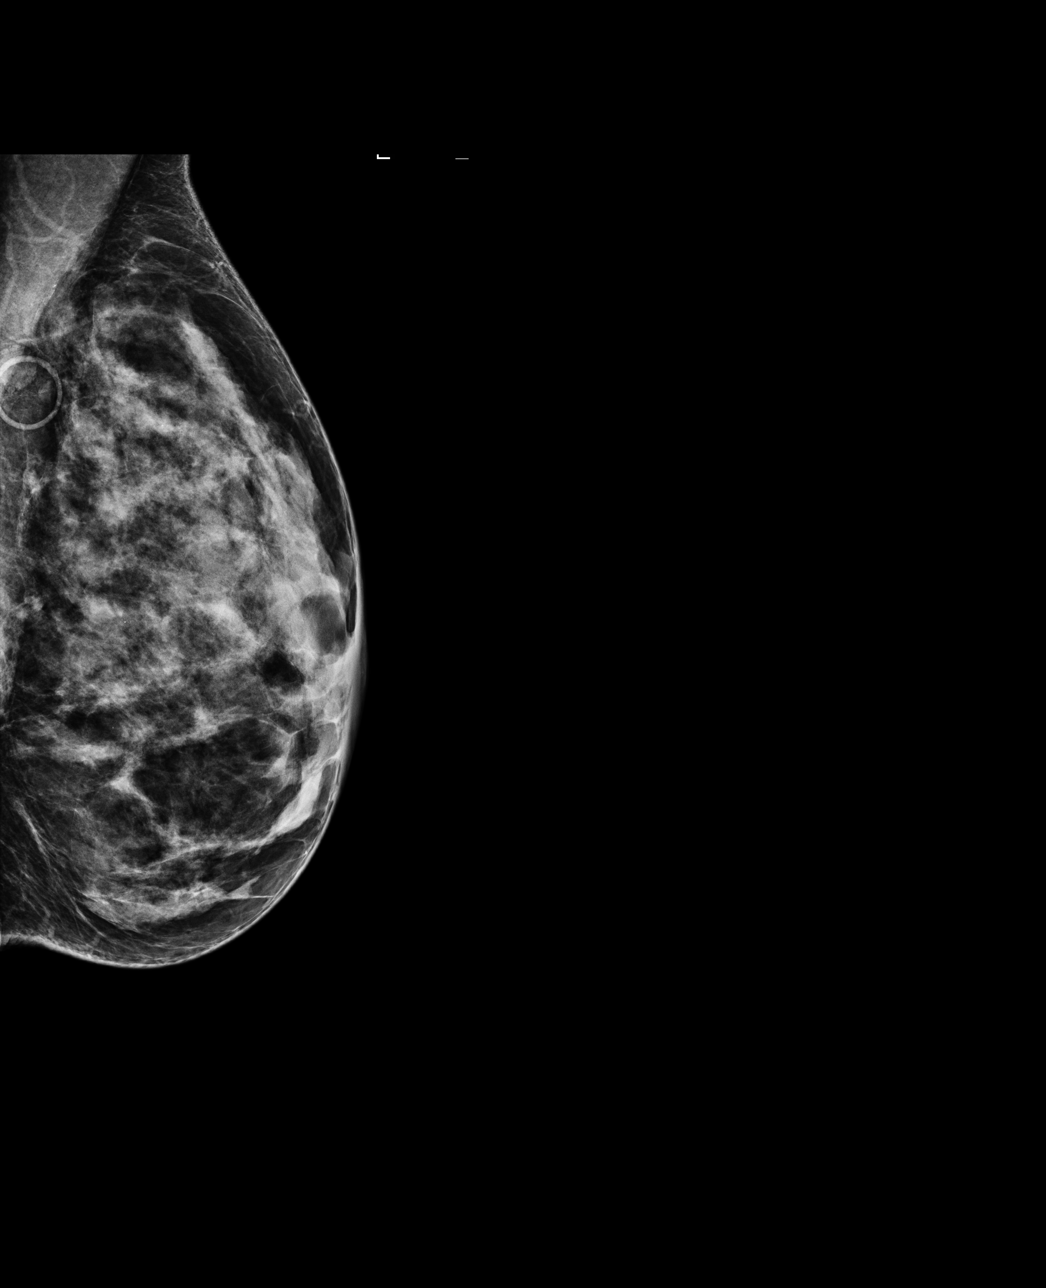

[4 of 4 positions shown; findings below may reference images not displayed]

ACR Breast Density Category c: The breast tissue is heterogeneously
dense, which may obscure small masses.
FINDINGS: There are no findings suspicious for malignancy. Images were
processed with CAD.
IMPRESSION: No mammographic evidence of malignancy. A result letter of this
screening mammogram will be mailed directly to the patient.

RECOMMENDATION:
Screening mammogram in one year. (Code:YJ-[id number])

BI-RADS CATEGORY  1: Negative.

## 2020-01-17 ENCOUNTER — Other Ambulatory Visit: Payer: Self-pay | Admitting: Family Medicine

## 2020-01-17 DIAGNOSIS — M810 Age-related osteoporosis without current pathological fracture: Secondary | ICD-10-CM

## 2020-01-17 DIAGNOSIS — Z1231 Encounter for screening mammogram for malignant neoplasm of breast: Secondary | ICD-10-CM

## 2020-04-27 ENCOUNTER — Other Ambulatory Visit: Payer: Self-pay

## 2020-04-27 ENCOUNTER — Other Ambulatory Visit: Payer: Medicare HMO

## 2020-04-27 ENCOUNTER — Ambulatory Visit
Admission: RE | Admit: 2020-04-27 | Discharge: 2020-04-27 | Disposition: A | Discharge status: Home / Self Care | Payer: Medicare HMO | Source: Ambulatory Visit | Attending: Family Medicine | Admitting: Family Medicine

## 2020-04-27 DIAGNOSIS — Z1231 Encounter for screening mammogram for malignant neoplasm of breast: Secondary | ICD-10-CM

## 2020-05-21 DIAGNOSIS — Z20822 Contact with and (suspected) exposure to covid-19: Secondary | ICD-10-CM | POA: Diagnosis not present

## 2020-05-21 DIAGNOSIS — U071 COVID-19: Secondary | ICD-10-CM | POA: Diagnosis not present

## 2020-09-20 DIAGNOSIS — R8290 Unspecified abnormal findings in urine: Secondary | ICD-10-CM | POA: Diagnosis not present

## 2020-09-20 DIAGNOSIS — R350 Frequency of micturition: Secondary | ICD-10-CM | POA: Diagnosis not present

## 2020-09-20 DIAGNOSIS — R300 Dysuria: Secondary | ICD-10-CM | POA: Diagnosis not present

## 2020-09-24 DIAGNOSIS — N898 Other specified noninflammatory disorders of vagina: Secondary | ICD-10-CM | POA: Diagnosis not present

## 2020-09-24 DIAGNOSIS — B373 Candidiasis of vulva and vagina: Secondary | ICD-10-CM | POA: Diagnosis not present

## 2020-09-26 ENCOUNTER — Ambulatory Visit
Admission: RE | Admit: 2020-09-26 | Discharge: 2020-09-26 | Disposition: A | Discharge status: Home / Self Care | Payer: Medicare HMO | Source: Ambulatory Visit | Attending: Family Medicine | Admitting: Family Medicine

## 2020-09-26 ENCOUNTER — Other Ambulatory Visit: Payer: Self-pay

## 2020-09-26 DIAGNOSIS — Z780 Asymptomatic menopausal state: Secondary | ICD-10-CM | POA: Diagnosis not present

## 2020-09-26 DIAGNOSIS — M810 Age-related osteoporosis without current pathological fracture: Secondary | ICD-10-CM | POA: Diagnosis not present

## 2021-01-14 DIAGNOSIS — M810 Age-related osteoporosis without current pathological fracture: Secondary | ICD-10-CM | POA: Diagnosis not present

## 2021-01-14 DIAGNOSIS — Z136 Encounter for screening for cardiovascular disorders: Secondary | ICD-10-CM | POA: Diagnosis not present

## 2021-01-14 DIAGNOSIS — R7301 Impaired fasting glucose: Secondary | ICD-10-CM | POA: Diagnosis not present

## 2021-01-14 DIAGNOSIS — A6004 Herpesviral vulvovaginitis: Secondary | ICD-10-CM | POA: Diagnosis not present

## 2021-01-14 DIAGNOSIS — E559 Vitamin D deficiency, unspecified: Secondary | ICD-10-CM | POA: Diagnosis not present

## 2021-01-14 DIAGNOSIS — Z23 Encounter for immunization: Secondary | ICD-10-CM | POA: Diagnosis not present

## 2021-01-14 DIAGNOSIS — Z13220 Encounter for screening for lipoid disorders: Secondary | ICD-10-CM | POA: Diagnosis not present

## 2021-01-14 DIAGNOSIS — Z0000 Encounter for general adult medical examination without abnormal findings: Secondary | ICD-10-CM | POA: Diagnosis not present

## 2021-01-23 DIAGNOSIS — Z23 Encounter for immunization: Secondary | ICD-10-CM | POA: Diagnosis not present

## 2022-01-20 DIAGNOSIS — Z0000 Encounter for general adult medical examination without abnormal findings: Secondary | ICD-10-CM | POA: Diagnosis not present

## 2022-01-20 DIAGNOSIS — E559 Vitamin D deficiency, unspecified: Secondary | ICD-10-CM | POA: Diagnosis not present

## 2022-01-20 DIAGNOSIS — M810 Age-related osteoporosis without current pathological fracture: Secondary | ICD-10-CM | POA: Diagnosis not present

## 2022-01-20 DIAGNOSIS — Z13220 Encounter for screening for lipoid disorders: Secondary | ICD-10-CM | POA: Diagnosis not present

## 2022-01-20 DIAGNOSIS — R7301 Impaired fasting glucose: Secondary | ICD-10-CM | POA: Diagnosis not present

## 2022-01-20 DIAGNOSIS — Z136 Encounter for screening for cardiovascular disorders: Secondary | ICD-10-CM | POA: Diagnosis not present

## 2022-01-20 DIAGNOSIS — A6004 Herpesviral vulvovaginitis: Secondary | ICD-10-CM | POA: Diagnosis not present

## 2022-01-20 DIAGNOSIS — Z23 Encounter for immunization: Secondary | ICD-10-CM | POA: Diagnosis not present

## 2022-07-28 DIAGNOSIS — H52221 Regular astigmatism, right eye: Secondary | ICD-10-CM | POA: Diagnosis not present

## 2022-08-12 DIAGNOSIS — H16223 Keratoconjunctivitis sicca, not specified as Sjogren's, bilateral: Secondary | ICD-10-CM | POA: Diagnosis not present

## 2022-08-12 DIAGNOSIS — H0288A Meibomian gland dysfunction right eye, upper and lower eyelids: Secondary | ICD-10-CM | POA: Diagnosis not present

## 2022-08-12 DIAGNOSIS — H0288B Meibomian gland dysfunction left eye, upper and lower eyelids: Secondary | ICD-10-CM | POA: Diagnosis not present

## 2022-11-06 DIAGNOSIS — D485 Neoplasm of uncertain behavior of skin: Secondary | ICD-10-CM | POA: Diagnosis not present

## 2022-11-06 DIAGNOSIS — C44319 Basal cell carcinoma of skin of other parts of face: Secondary | ICD-10-CM | POA: Diagnosis not present

## 2022-11-06 DIAGNOSIS — L821 Other seborrheic keratosis: Secondary | ICD-10-CM | POA: Diagnosis not present

## 2022-11-27 DIAGNOSIS — C44319 Basal cell carcinoma of skin of other parts of face: Secondary | ICD-10-CM | POA: Diagnosis not present

## 2022-12-04 DIAGNOSIS — C44319 Basal cell carcinoma of skin of other parts of face: Secondary | ICD-10-CM | POA: Diagnosis not present

## 2022-12-25 DIAGNOSIS — Z23 Encounter for immunization: Secondary | ICD-10-CM | POA: Diagnosis not present

## 2023-01-27 DIAGNOSIS — R7301 Impaired fasting glucose: Secondary | ICD-10-CM | POA: Diagnosis not present

## 2023-01-27 DIAGNOSIS — Z6820 Body mass index (BMI) 20.0-20.9, adult: Secondary | ICD-10-CM | POA: Diagnosis not present

## 2023-01-27 DIAGNOSIS — Z0000 Encounter for general adult medical examination without abnormal findings: Secondary | ICD-10-CM | POA: Diagnosis not present

## 2023-01-27 DIAGNOSIS — Z136 Encounter for screening for cardiovascular disorders: Secondary | ICD-10-CM | POA: Diagnosis not present

## 2023-01-27 DIAGNOSIS — Z13220 Encounter for screening for lipoid disorders: Secondary | ICD-10-CM | POA: Diagnosis not present

## 2023-01-27 DIAGNOSIS — M810 Age-related osteoporosis without current pathological fracture: Secondary | ICD-10-CM | POA: Diagnosis not present

## 2023-01-27 DIAGNOSIS — E559 Vitamin D deficiency, unspecified: Secondary | ICD-10-CM | POA: Diagnosis not present

## 2023-01-28 ENCOUNTER — Other Ambulatory Visit: Payer: Self-pay | Admitting: Family Medicine

## 2023-01-28 DIAGNOSIS — M810 Age-related osteoporosis without current pathological fracture: Secondary | ICD-10-CM

## 2023-01-29 ENCOUNTER — Other Ambulatory Visit: Payer: Self-pay | Admitting: Family Medicine

## 2023-01-29 DIAGNOSIS — Z1231 Encounter for screening mammogram for malignant neoplasm of breast: Secondary | ICD-10-CM

## 2023-04-09 ENCOUNTER — Inpatient Hospital Stay (HOSPITAL_BASED_OUTPATIENT_CLINIC_OR_DEPARTMENT_OTHER)
Admission: EM | Admit: 2023-04-09 | Discharge: 2023-04-22 | DRG: 742 | Disposition: A | Discharge status: Home / Self Care | Payer: Medicare HMO | Attending: Internal Medicine | Admitting: Internal Medicine

## 2023-04-09 ENCOUNTER — Encounter (HOSPITAL_BASED_OUTPATIENT_CLINIC_OR_DEPARTMENT_OTHER): Payer: Self-pay | Admitting: Emergency Medicine

## 2023-04-09 ENCOUNTER — Emergency Department (HOSPITAL_BASED_OUTPATIENT_CLINIC_OR_DEPARTMENT_OTHER): Payer: Medicare HMO

## 2023-04-09 ENCOUNTER — Other Ambulatory Visit: Payer: Self-pay

## 2023-04-09 DIAGNOSIS — R1030 Lower abdominal pain, unspecified: Secondary | ICD-10-CM | POA: Diagnosis not present

## 2023-04-09 DIAGNOSIS — C20 Malignant neoplasm of rectum: Secondary | ICD-10-CM | POA: Diagnosis present

## 2023-04-09 DIAGNOSIS — Z803 Family history of malignant neoplasm of breast: Secondary | ICD-10-CM | POA: Diagnosis not present

## 2023-04-09 DIAGNOSIS — Z90722 Acquired absence of ovaries, bilateral: Secondary | ICD-10-CM | POA: Diagnosis not present

## 2023-04-09 DIAGNOSIS — Z801 Family history of malignant neoplasm of trachea, bronchus and lung: Secondary | ICD-10-CM

## 2023-04-09 DIAGNOSIS — N9089 Other specified noninflammatory disorders of vulva and perineum: Secondary | ICD-10-CM | POA: Diagnosis not present

## 2023-04-09 DIAGNOSIS — K5900 Constipation, unspecified: Secondary | ICD-10-CM | POA: Diagnosis not present

## 2023-04-09 DIAGNOSIS — Z888 Allergy status to other drugs, medicaments and biological substances status: Secondary | ICD-10-CM

## 2023-04-09 DIAGNOSIS — Z90710 Acquired absence of both cervix and uterus: Secondary | ICD-10-CM | POA: Diagnosis not present

## 2023-04-09 DIAGNOSIS — K629 Disease of anus and rectum, unspecified: Secondary | ICD-10-CM | POA: Diagnosis not present

## 2023-04-09 DIAGNOSIS — K640 First degree hemorrhoids: Secondary | ICD-10-CM | POA: Diagnosis present

## 2023-04-09 DIAGNOSIS — R509 Fever, unspecified: Secondary | ICD-10-CM | POA: Diagnosis not present

## 2023-04-09 DIAGNOSIS — E871 Hypo-osmolality and hyponatremia: Secondary | ICD-10-CM | POA: Diagnosis present

## 2023-04-09 DIAGNOSIS — Z4682 Encounter for fitting and adjustment of non-vascular catheter: Secondary | ICD-10-CM | POA: Diagnosis not present

## 2023-04-09 DIAGNOSIS — R933 Abnormal findings on diagnostic imaging of other parts of digestive tract: Secondary | ICD-10-CM | POA: Diagnosis not present

## 2023-04-09 DIAGNOSIS — N739 Female pelvic inflammatory disease, unspecified: Principal | ICD-10-CM

## 2023-04-09 DIAGNOSIS — F32A Depression, unspecified: Secondary | ICD-10-CM | POA: Diagnosis not present

## 2023-04-09 DIAGNOSIS — K6389 Other specified diseases of intestine: Secondary | ICD-10-CM | POA: Diagnosis not present

## 2023-04-09 DIAGNOSIS — N7093 Salpingitis and oophoritis, unspecified: Secondary | ICD-10-CM | POA: Diagnosis not present

## 2023-04-09 DIAGNOSIS — R1032 Left lower quadrant pain: Secondary | ICD-10-CM | POA: Diagnosis not present

## 2023-04-09 DIAGNOSIS — K56609 Unspecified intestinal obstruction, unspecified as to partial versus complete obstruction: Principal | ICD-10-CM

## 2023-04-09 DIAGNOSIS — Z1611 Resistance to penicillins: Secondary | ICD-10-CM | POA: Diagnosis present

## 2023-04-09 DIAGNOSIS — N840 Polyp of corpus uteri: Secondary | ICD-10-CM | POA: Diagnosis not present

## 2023-04-09 DIAGNOSIS — B9620 Unspecified Escherichia coli [E. coli] as the cause of diseases classified elsewhere: Secondary | ICD-10-CM | POA: Diagnosis present

## 2023-04-09 DIAGNOSIS — M810 Age-related osteoporosis without current pathological fracture: Secondary | ICD-10-CM | POA: Diagnosis not present

## 2023-04-09 DIAGNOSIS — R1084 Generalized abdominal pain: Secondary | ICD-10-CM | POA: Diagnosis not present

## 2023-04-09 DIAGNOSIS — R935 Abnormal findings on diagnostic imaging of other abdominal regions, including retroperitoneum: Secondary | ICD-10-CM | POA: Diagnosis not present

## 2023-04-09 DIAGNOSIS — B009 Herpesviral infection, unspecified: Secondary | ICD-10-CM | POA: Diagnosis not present

## 2023-04-09 DIAGNOSIS — Z808 Family history of malignant neoplasm of other organs or systems: Secondary | ICD-10-CM

## 2023-04-09 DIAGNOSIS — N281 Cyst of kidney, acquired: Secondary | ICD-10-CM | POA: Diagnosis not present

## 2023-04-09 DIAGNOSIS — K6289 Other specified diseases of anus and rectum: Secondary | ICD-10-CM | POA: Diagnosis not present

## 2023-04-09 DIAGNOSIS — R1900 Intra-abdominal and pelvic swelling, mass and lump, unspecified site: Secondary | ICD-10-CM | POA: Diagnosis present

## 2023-04-09 DIAGNOSIS — R64 Cachexia: Secondary | ICD-10-CM | POA: Diagnosis not present

## 2023-04-09 DIAGNOSIS — Z9079 Acquired absence of other genital organ(s): Secondary | ICD-10-CM | POA: Diagnosis not present

## 2023-04-09 DIAGNOSIS — Z96641 Presence of right artificial hip joint: Secondary | ICD-10-CM | POA: Diagnosis present

## 2023-04-09 DIAGNOSIS — Z6820 Body mass index (BMI) 20.0-20.9, adult: Secondary | ICD-10-CM | POA: Diagnosis not present

## 2023-04-09 DIAGNOSIS — K56600 Partial intestinal obstruction, unspecified as to cause: Secondary | ICD-10-CM | POA: Diagnosis not present

## 2023-04-09 DIAGNOSIS — R978 Other abnormal tumor markers: Secondary | ICD-10-CM | POA: Diagnosis not present

## 2023-04-09 DIAGNOSIS — Z8249 Family history of ischemic heart disease and other diseases of the circulatory system: Secondary | ICD-10-CM

## 2023-04-09 DIAGNOSIS — E876 Hypokalemia: Secondary | ICD-10-CM | POA: Diagnosis not present

## 2023-04-09 DIAGNOSIS — N83201 Unspecified ovarian cyst, right side: Secondary | ICD-10-CM | POA: Diagnosis not present

## 2023-04-09 DIAGNOSIS — K626 Ulcer of anus and rectum: Secondary | ICD-10-CM | POA: Diagnosis not present

## 2023-04-09 DIAGNOSIS — R1909 Other intra-abdominal and pelvic swelling, mass and lump: Secondary | ICD-10-CM | POA: Diagnosis not present

## 2023-04-09 DIAGNOSIS — R9431 Abnormal electrocardiogram [ECG] [EKG]: Secondary | ICD-10-CM | POA: Diagnosis not present

## 2023-04-09 DIAGNOSIS — N888 Other specified noninflammatory disorders of cervix uteri: Secondary | ICD-10-CM | POA: Diagnosis not present

## 2023-04-09 DIAGNOSIS — R140 Abdominal distension (gaseous): Secondary | ICD-10-CM | POA: Diagnosis not present

## 2023-04-09 DIAGNOSIS — I700 Atherosclerosis of aorta: Secondary | ICD-10-CM | POA: Diagnosis present

## 2023-04-09 DIAGNOSIS — R197 Diarrhea, unspecified: Secondary | ICD-10-CM | POA: Diagnosis not present

## 2023-04-09 DIAGNOSIS — K5669 Other partial intestinal obstruction: Secondary | ICD-10-CM | POA: Diagnosis not present

## 2023-04-09 LAB — COMPREHENSIVE METABOLIC PANEL
ALT: 13 U/L (ref 0–44)
AST: 11 U/L — ABNORMAL LOW (ref 15–41)
Albumin: 3.7 g/dL (ref 3.5–5.0)
Alkaline Phosphatase: 74 U/L (ref 38–126)
Anion gap: 10 (ref 5–15)
BUN: 20 mg/dL (ref 8–23)
CO2: 23 mmol/L (ref 22–32)
Calcium: 9 mg/dL (ref 8.9–10.3)
Chloride: 101 mmol/L (ref 98–111)
Creatinine, Ser: 0.73 mg/dL (ref 0.44–1.00)
GFR, Estimated: >60 mL/min (ref >60)
Glucose, Bld: 147 mg/dL — ABNORMAL HIGH (ref 70–99)
Potassium: 3.5 mmol/L (ref 3.5–5.1)
Sodium: 134 mmol/L — ABNORMAL LOW (ref 135–145)
Total Bilirubin: 0.8 mg/dL (ref 0.0–1.2)
Total Protein: 7.1 g/dL (ref 6.5–8.1)

## 2023-04-09 LAB — URINALYSIS, ROUTINE W REFLEX MICROSCOPIC
Bacteria, UA: NONE SEEN
Bilirubin Urine: NEGATIVE
Glucose, UA: NEGATIVE mg/dL
Ketones, ur: NEGATIVE mg/dL
Leukocytes,Ua: NEGATIVE
Nitrite: NEGATIVE
Protein, ur: 100 mg/dL — AB
Specific Gravity, Urine: >1.046 — ABNORMAL HIGH (ref 1.005–1.030)
pH: 6 (ref 5.0–8.0)

## 2023-04-09 LAB — CBC
HCT: 37.7 % (ref 36.0–46.0)
Hemoglobin: 12.6 g/dL (ref 12.0–15.0)
MCH: 28.6 pg (ref 26.0–34.0)
MCHC: 33.4 g/dL (ref 30.0–36.0)
MCV: 85.7 fL (ref 80.0–100.0)
Platelets: 284 10*3/uL (ref 150–400)
RBC: 4.4 MIL/uL (ref 3.87–5.11)
RDW: 13.4 % (ref 11.5–15.5)
WBC: 16.1 10*3/uL — ABNORMAL HIGH (ref 4.0–10.5)
nRBC: 0 % (ref 0.0–0.2)

## 2023-04-09 LAB — LIPASE, BLOOD: Lipase: <10 U/L — ABNORMAL LOW (ref 11–51)

## 2023-04-09 MED ORDER — IOHEXOL 300 MG/ML  SOLN
100.0000 mL | Freq: Once | INTRAMUSCULAR | Status: AC | PRN
  Administered 2023-04-09: 85 mL via INTRAVENOUS

## 2023-04-09 MED ORDER — ONDANSETRON HCL 4 MG/2ML IJ SOLN
4.0000 mg | Freq: Once | INTRAMUSCULAR | Status: AC
  Administered 2023-04-09: 4 mg via INTRAVENOUS
  Filled 2023-04-09: qty 2

## 2023-04-09 MED ORDER — MORPHINE SULFATE (PF) 2 MG/ML IV SOLN
2.0000 mg | Freq: Once | INTRAVENOUS | Status: AC
  Administered 2023-04-09: 2 mg via INTRAVENOUS
  Filled 2023-04-09: qty 1

## 2023-04-09 MED ORDER — SODIUM CHLORIDE 0.9 % IV BOLUS
1000.0000 mL | Freq: Once | INTRAVENOUS | Status: AC
  Administered 2023-04-09: 1000 mL via INTRAVENOUS

## 2023-04-09 NOTE — ED Notes (Signed)
Pt was unable to provide a urine sample due to having diarrhea at the same time.

## 2023-04-09 NOTE — ED Notes (Signed)
NGT advanced 8cm per Dr. Silvano Rusk order. Call ing radiology for a f/u xray.

## 2023-04-09 NOTE — ED Triage Notes (Signed)
Pt c/o "severe" lower abd pain x 3 days, with fever, n/v/d, referred by PCP for "scan of stomach". Endorses n/v today. Toradol at pcp

## 2023-04-09 NOTE — ED Provider Notes (Signed)
EMERGENCY DEPARTMENT AT [facility] Provider Note   CSN: [id number] Arrival date & time: 04/09/23  1601     History  Chief Complaint  Patient presents with   Abdominal Pain    Tonya LUVIANO is a 68 y.o. female presents today for lower abdominal pain x 3 days.  Patient also endorses fever, nausea, NBNB vomiting, and diarrhea.  Patient was seen at her PCP and referred here for "scan of stomach ".  Patient states that she has been dealing with similar symptoms since Christmas, but they got significantly worse 3 days ago.  Patient denies blood in her stool, hematuria, dysuria, shortness of breath, chest pain, weakness, dizziness, congestion or cough.   Abdominal Pain Associated symptoms: diarrhea, fever, nausea and vomiting        Home Medications Prior to Admission medications   Not on File      Allergies    Patient has no allergy information on record.    Review of Systems   Review of Systems  Constitutional:  Positive for fever.  Gastrointestinal:  Positive for abdominal pain, diarrhea, nausea and vomiting.    Physical Exam Updated Vital Signs BP 121/74   Pulse 82   Temp 98 F (36.7 C) (Oral)   Resp 18   Wt 60.8 kg   SpO2 98%  Physical Exam Vitals and nursing note reviewed.  Constitutional:      General: She is not in acute distress.    Appearance: She is well-developed. She is not toxic-appearing.  HENT:     Head: Normocephalic and atraumatic.     Mouth/Throat:     Mouth: Mucous membranes are moist.  Eyes:     Extraocular Movements: Extraocular movements intact.     Conjunctiva/sclera: Conjunctivae normal.  Cardiovascular:     Rate and Rhythm: Normal rate and regular rhythm.     Heart sounds: Normal heart sounds. No murmur heard. Pulmonary:     Effort: Pulmonary effort is normal. No respiratory distress.     Breath sounds: Normal breath sounds.  Abdominal:     General: Bowel sounds are normal. There is distension.      Palpations: Abdomen is soft.     Tenderness: There is abdominal tenderness in the right lower quadrant and left lower quadrant. There is no right CVA tenderness or left CVA tenderness.  Musculoskeletal:        General: No swelling.     Cervical back: Neck supple.  Skin:    General: Skin is warm and dry.     Capillary Refill: Capillary refill takes less than 2 seconds.  Neurological:     General: No focal deficit present.     Mental Status: She is alert.     Motor: No weakness.  Psychiatric:        Mood and Affect: Mood normal.     ED Results / Procedures / Treatments   Labs (all labs ordered are listed, but only abnormal results are displayed) Labs Reviewed  LIPASE, BLOOD - Abnormal; Notable for the following components:      Result Value   Lipase <10 (*)    All other components within normal limits  COMPREHENSIVE METABOLIC PANEL - Abnormal; Notable for the following components:   Sodium 134 (*)    Glucose, Bld 147 (*)    AST 11 (*)    All other components within normal limits  CBC - Abnormal; Notable for the following components:   WBC 16.1 (*)  All other components within normal limits  URINALYSIS, ROUTINE W REFLEX MICROSCOPIC - Abnormal; Notable for the following components:   Specific Gravity, Urine >1.046 (*)    Hgb urine dipstick SMALL (*)    Protein, ur 100 (*)    All other components within normal limits    EKG None  Radiology US PELVIC TRANSABD W/PELVIC DOPPLER Result Date: 04/09/2023 CLINICAL DATA:  Cystic structure seen on CT EXAM: TRANSABDOMINAL ULTRASOUND OF PELVIS DOPPLER ULTRASOUND OF OVARIES TECHNIQUE: Transabdominal ultrasound examination of the pelvis was performed including evaluation of the uterus, ovaries, adnexal regions, and pelvic cul-de-sac. Color and duplex Doppler ultrasound was utilized to evaluate blood flow to the ovaries. COMPARISON:  CT 04/09/2023, 08/22/2009 FINDINGS: Uterus Measurements: 6.4 x 3.4 x 4.2 cm = volume: 49 mL. No fibroids  or other mass visualized. Uterus is anteriorly displaced by pelvic mass. Endometrium Thickness: 6.6 mm.  No focal abnormality visualized. Right ovary Measurements: 1.1 x 3.0 x 1.5 cm = volume: 3 mL. Normal appearance/no adnexal mass. Left ovary Not definitively visualized. Large cystic mass in the posterior pelvis/left adnexal region measuring 10.8 x 11.5 x 10.4 cm. This cystic mass is thick-walled with homogeneous low level internal echoes. No vascularity was evident internally or within the walls of the cystic mass. Pulsed Doppler evaluation demonstrates normal low-resistance arterial and venous waveforms in the right ovary. Other: No free fluid is seen within the pelvis. IMPRESSION: 1. Large cystic mass in the posterior pelvis/left adnexal region measuring up to 11.5 cm. This cystic mass is thick-walled with low level internal echoes. No internal vascularity was evident. Findings are most suspicious for cystic neoplasm. Gynecology consultation is recommended. 2. The left ovary was not definitively visualized. 3. Normal sonographic appearance of the uterus and right ovary. Electronically Signed   By: Duanne Guess D.O.   On: 04/09/2023 20:29   CT ABDOMEN PELVIS W CONTRAST Result Date: 04/09/2023 CLINICAL DATA:  Abdominal pain and fever. Nausea vomiting and diarrhea. EXAM: CT ABDOMEN AND PELVIS WITH CONTRAST TECHNIQUE: Multidetector CT imaging of the abdomen and pelvis was performed using the standard protocol following bolus administration of intravenous contrast. RADIATION DOSE REDUCTION: This exam was performed according to the departmental dose-optimization program which includes automated exposure control, adjustment of the mA and/or kV according to patient size and/or use of iterative reconstruction technique. CONTRAST:  85mL OMNIPAQUE IOHEXOL 300 MG/ML  SOLN COMPARISON:  CT abdomen pelvis dated 08/22/2009. FINDINGS: Lower chest: The visualized lung bases are clear. No intra-abdominal free air or free  fluid. Hepatobiliary: The liver is unremarkable. No biliary ductal dilatation. The gallbladder is unremarkable. Pancreas: Unremarkable. No pancreatic ductal dilatation or surrounding inflammatory changes. Spleen: Normal in size without focal abnormality. Adrenals/Urinary Tract: The adrenal glands are unremarkable. Small right renal inferior pole cyst. There is no hydronephrosis on either side. There is symmetric enhancement of the kidneys. The urinary bladder is minimally distended and grossly unremarkable. Stomach/Bowel: There is large amount of stool throughout the colon. There is distension of the rectal vault and sigmoid colon with large amount of stool measuring up to 10 cm in diameter. No evidence of small-bowel obstruction. The appendix is not visualized with certainty. No inflammatory changes identified in the right lower quadrant. Vascular/Lymphatic: Mild aortoiliac atherosclerotic disease. The IVC is unremarkable. No portal venous gas. There is no adenopathy. Reproductive: The uterus is anteverted and poorly visualized. There is a 9 x 11 x 13 cm fluid collection in the pelvis which is not evaluated but may represent a fluid distended  vagina or cervical region. Clinical correlation and further evaluation with pelvic ultrasound recommended. Other: Loss of subcutaneous and intra-abdominal fat and cachexia. Musculoskeletal: Osteopenia with multilevel degenerative changes. Right hip arthroplasty. No acute osseous pathology. IMPRESSION: 1. Large fluid collection or cystic structure within the pelvis may represent hydrocolpos. Clinical correlation and further evaluation with pelvic ultrasound recommended. 2. Distended rectal vault and sigmoid colon with large stool burden. Findings may represent a colonic dysmotility or a degree of obstruction related to compression of the rectum by the pelvic cystic structure. No evidence of small-bowel obstruction. 3.  Aortic Atherosclerosis (ICD10-I70.0). Electronically Signed    By: Elgie Collard M.D.   On: 04/09/2023 18:52    Procedures Procedures    Medications Ordered in ED Medications  ondansetron (ZOFRAN) injection 4 mg (4 mg Intravenous Given 04/09/23 1734)  morphine (PF) 2 MG/ML injection 2 mg (2 mg Intravenous Given 04/09/23 1734)  iohexol (OMNIPAQUE) 300 MG/ML solution 100 mL (85 mLs Intravenous Contrast Given 04/09/23 1800)  morphine (PF) 2 MG/ML injection 2 mg (2 mg Intravenous Given 04/09/23 2141)  ondansetron (ZOFRAN) injection 4 mg (4 mg Intravenous Given 04/09/23 2141)  sodium chloride 0.9 % bolus 1,000 mL (1,000 mLs Intravenous New Bag/Given 04/09/23 2140)    ED Course/ Medical Decision Making/ A&P                                 Medical Decision Making Amount and/or Complexity of Data Reviewed Labs: ordered.   This patient presents to the ED with chief complaint(s) of nausea/vomiting/diarrhea with abdominal pain with pertinent past medical history of none which further complicates the presenting complaint. The complaint involves an extensive differential diagnosis and also carries with it a high risk of complications and morbidity.    The differential diagnosis includes appendicitis, pancreatitis, diverticulitis, SBO, intra-abdominal infection  Additional history obtained: Additional history obtained from spouse Records reviewed Care Everywhere/External Records  ED Course and Reassessment: NG tube placed per general surgery request  Independent labs interpretation:  The following labs were independently interpreted:  CBC: Leukocytosis at 16.1 CMP: Mild hyponatremia at 134, mildly decreased AST at 11 Lipase: <10 UA: Small hemoglobin, 100 protein, elevated specific gravity, 11-20 RBCs EKG: Normal sinus rhythm, possible lateral infarct age-indeterminate  Independent visualization of imaging: - I independently visualized the following imaging with scope of interpretation limited to determining acute life threatening conditions  related to emergency care: CT abdomen pelvis with contrast, which revealed large fluid collection or cystic structure within the pelvis may represent hydrocolpos.  Distended rectal wall and sigmoid colon with large stool burden.  May represent degree of obstruction related to compression of the rectum by the pelvic cystic structure.   Pelvic ultrasound: 11.5 cm large cystic mass in posterior pelvis/left adnexal region suspicious for cystic neoplasm, left ovary not well-visualized, normal sonographic appearance of the uterus and right ovary.  Consultation: - Consulted or discussed management/test interpretation w/ external professional: GYN/ONC, Dr. Tamela Oddi who is agreeable to consult on the patient after admission to [facility]. General Surgery, Dr. Sheliah Hatch who was agreeable to consulting on the patient while admitted. Hospitalist, Dr. Janalyn Shy who was agreeable to admission for pelvic mass with bowel obstruction  Consideration for admission or further workup: Patient being admitted for pelvic mass causing large bowel obstruction        Final Clinical Impression(s) / ED Diagnoses Final diagnoses:  Intestinal obstruction, unspecified cause, unspecified whether partial or complete (HCC)  Pelvic mass    Rx / DC Orders ED Discharge Orders     None         Gretta Began 04/09/23 2149    Rondel Baton, MD 04/13/23 [phone]

## 2023-04-09 NOTE — Plan of Care (Addendum)
Plan of Care Note for accepted transfer  Patient: Tonya Schneider              ION:[id number]  DOA: 04/09/2023     Facility requesting transfer: [facility]  emergency department Requesting Provider: Dolphus Jenny, PA-C    Reason for transfer: Pelvic cystic fluid collection and colonic dysmotility/degree of obstruction related to compression by pelvic cystic mass.  Facility course: Tonya Schneider is a 68 y.o. female presents today for lower abdominal pain x 3 days.  Patient also endorses fever, nausea, NBNB vomiting, and diarrhea.  Patient was seen at her PCP and referred here for "scan of stomach ".  Patient states that she has been dealing with similar symptoms since Christmas, but they got significantly worse 3 days ago.  Patient denies blood in her stool, hematuria, dysuria, shortness of breath, chest pain, weakness, dizziness, congestion or cough.    At presentation to ED patient is hemodynamically stable. UA showed increased specific gravity, dipstick hemoglobin positive and protein 100. CBC showing leukocytosis 16.1 otherwise unremarkable. CMP showing slight low sodium 134, low AST 11 otherwise normal findings. Lipase less than 10.  CT abdomen pelvis showed: IMPRESSION: 1. Large fluid collection or cystic structure within the pelvis may represent hydrocolpos. Clinical correlation and further evaluation with pelvic ultrasound recommended. 2. Distended rectal vault and sigmoid colon with large stool burden. Findings may represent a colonic dysmotility or a degree of obstruction related to compression of the rectum by the pelvic cystic structure. No evidence of small-bowel obstruction. 3.  Aortic Atherosclerosis (ICD10-I70.0).   ED provider spoke with on-call oncology gynecology Dr. Tamela Oddi who recommended admit the patient to [facility] long hospital.  Need to inform gynecology upon arrival to the hospital.  Afterward also spoken with general surgery Dr. Sheliah Hatch  recommended NG tube placement and will evaluate patient upon arrival to [facility] long hospital.  Need to inform general surgery as well once patient will arrive to the hospital.  In the ED patient has been given 1 L of LR bolus, Zofran x 2, morphine 2 mg x 2 doses.  NG tube will be placing soon.  Hospitalist has been contacted for further management of colonic dysmotility/degree of obstruction renal to compression by pelvic cystic mass.   Plan of care: The patient is accepted for admission for inpatient status to Med-surg  unit, at [facility].  Check [email or web address] for on-call coverage.  [facility] will assume care on arrival to accepting facility. Until arrival, medical decision making responsibilities remain with the EDP.  However, [facility] available 24/7 for questions and assistance.   Nursing staff please page [facility] Admits and Consults [phone]) as soon as the patient arrives to the hospital.    Author: Tereasa Coop, MD  04/09/2023  [facility]

## 2023-04-10 DIAGNOSIS — Z8249 Family history of ischemic heart disease and other diseases of the circulatory system: Secondary | ICD-10-CM | POA: Diagnosis not present

## 2023-04-10 DIAGNOSIS — R1032 Left lower quadrant pain: Secondary | ICD-10-CM | POA: Diagnosis not present

## 2023-04-10 DIAGNOSIS — K56609 Unspecified intestinal obstruction, unspecified as to partial versus complete obstruction: Secondary | ICD-10-CM | POA: Diagnosis not present

## 2023-04-10 DIAGNOSIS — Z801 Family history of malignant neoplasm of trachea, bronchus and lung: Secondary | ICD-10-CM | POA: Diagnosis not present

## 2023-04-10 DIAGNOSIS — I700 Atherosclerosis of aorta: Secondary | ICD-10-CM | POA: Diagnosis present

## 2023-04-10 DIAGNOSIS — K5900 Constipation, unspecified: Secondary | ICD-10-CM

## 2023-04-10 DIAGNOSIS — B009 Herpesviral infection, unspecified: Secondary | ICD-10-CM

## 2023-04-10 DIAGNOSIS — R1900 Intra-abdominal and pelvic swelling, mass and lump, unspecified site: Secondary | ICD-10-CM

## 2023-04-10 DIAGNOSIS — Z888 Allergy status to other drugs, medicaments and biological substances status: Secondary | ICD-10-CM | POA: Diagnosis not present

## 2023-04-10 DIAGNOSIS — B9620 Unspecified Escherichia coli [E. coli] as the cause of diseases classified elsewhere: Secondary | ICD-10-CM | POA: Diagnosis present

## 2023-04-10 DIAGNOSIS — R978 Other abnormal tumor markers: Secondary | ICD-10-CM | POA: Diagnosis not present

## 2023-04-10 DIAGNOSIS — K5669 Other partial intestinal obstruction: Secondary | ICD-10-CM | POA: Diagnosis not present

## 2023-04-10 DIAGNOSIS — R1909 Other intra-abdominal and pelvic swelling, mass and lump: Secondary | ICD-10-CM | POA: Diagnosis not present

## 2023-04-10 DIAGNOSIS — Z90710 Acquired absence of both cervix and uterus: Secondary | ICD-10-CM | POA: Diagnosis not present

## 2023-04-10 DIAGNOSIS — M810 Age-related osteoporosis without current pathological fracture: Secondary | ICD-10-CM

## 2023-04-10 DIAGNOSIS — K6289 Other specified diseases of anus and rectum: Secondary | ICD-10-CM | POA: Diagnosis not present

## 2023-04-10 DIAGNOSIS — Z96641 Presence of right artificial hip joint: Secondary | ICD-10-CM | POA: Diagnosis present

## 2023-04-10 DIAGNOSIS — Z4682 Encounter for fitting and adjustment of non-vascular catheter: Secondary | ICD-10-CM | POA: Diagnosis not present

## 2023-04-10 DIAGNOSIS — R935 Abnormal findings on diagnostic imaging of other abdominal regions, including retroperitoneum: Secondary | ICD-10-CM | POA: Diagnosis not present

## 2023-04-10 DIAGNOSIS — N739 Female pelvic inflammatory disease, unspecified: Secondary | ICD-10-CM | POA: Diagnosis present

## 2023-04-10 DIAGNOSIS — Z803 Family history of malignant neoplasm of breast: Secondary | ICD-10-CM

## 2023-04-10 DIAGNOSIS — F32A Depression, unspecified: Secondary | ICD-10-CM

## 2023-04-10 DIAGNOSIS — K640 First degree hemorrhoids: Secondary | ICD-10-CM | POA: Diagnosis present

## 2023-04-10 DIAGNOSIS — Z1611 Resistance to penicillins: Secondary | ICD-10-CM | POA: Diagnosis present

## 2023-04-10 DIAGNOSIS — K56600 Partial intestinal obstruction, unspecified as to cause: Secondary | ICD-10-CM | POA: Diagnosis not present

## 2023-04-10 DIAGNOSIS — Z808 Family history of malignant neoplasm of other organs or systems: Secondary | ICD-10-CM | POA: Diagnosis not present

## 2023-04-10 DIAGNOSIS — N281 Cyst of kidney, acquired: Secondary | ICD-10-CM | POA: Diagnosis not present

## 2023-04-10 DIAGNOSIS — E871 Hypo-osmolality and hyponatremia: Secondary | ICD-10-CM | POA: Diagnosis present

## 2023-04-10 DIAGNOSIS — K626 Ulcer of anus and rectum: Secondary | ICD-10-CM | POA: Diagnosis not present

## 2023-04-10 DIAGNOSIS — C20 Malignant neoplasm of rectum: Secondary | ICD-10-CM | POA: Diagnosis present

## 2023-04-10 DIAGNOSIS — E876 Hypokalemia: Secondary | ICD-10-CM | POA: Diagnosis not present

## 2023-04-10 DIAGNOSIS — R1084 Generalized abdominal pain: Secondary | ICD-10-CM | POA: Diagnosis not present

## 2023-04-10 LAB — HIV ANTIBODY (ROUTINE TESTING W REFLEX): HIV Screen 4th Generation wRfx: NONREACTIVE

## 2023-04-10 MED ORDER — ACETAMINOPHEN 650 MG RE SUPP: 650.0000 mg | Freq: Four times a day (QID) | RECTAL | Status: DC | PRN

## 2023-04-10 MED ORDER — ONDANSETRON HCL 4 MG/2ML IJ SOLN
4.0000 mg | Freq: Four times a day (QID) | INTRAMUSCULAR | Status: DC | PRN
  Administered 2023-04-10 – 2023-04-18 (×4): 4 mg via INTRAVENOUS
  Filled 2023-04-10 (×4): qty 2

## 2023-04-10 MED ORDER — ENOXAPARIN SODIUM 40 MG/0.4ML IJ SOSY
40.0000 mg | PREFILLED_SYRINGE | INTRAMUSCULAR | Status: DC
  Administered 2023-04-10 – 2023-04-12 (×3): 40 mg via SUBCUTANEOUS
  Filled 2023-04-10 (×3): qty 0.4

## 2023-04-10 MED ORDER — ACETAMINOPHEN 325 MG PO TABS
650.0000 mg | ORAL_TABLET | Freq: Four times a day (QID) | ORAL | Status: DC | PRN
  Administered 2023-04-15 – 2023-04-16 (×2): 650 mg via ORAL
  Filled 2023-04-10 (×2): qty 2

## 2023-04-10 MED ORDER — LORAZEPAM 2 MG/ML IJ SOLN
1.0000 mg | INTRAMUSCULAR | Status: DC | PRN
  Administered 2023-04-10 – 2023-04-20 (×4): 1 mg via INTRAVENOUS
  Filled 2023-04-10 (×4): qty 1

## 2023-04-10 MED ORDER — ALBUTEROL SULFATE (2.5 MG/3ML) 0.083% IN NEBU
2.5000 mg | INHALATION_SOLUTION | RESPIRATORY_TRACT | Status: DC | PRN
Start: 2023-04-10 — End: 2023-04-20

## 2023-04-10 MED ORDER — HYDROMORPHONE HCL 2 MG PO TABS
2.0000 mg | ORAL_TABLET | ORAL | Status: DC | PRN
  Administered 2023-04-10: 2 mg via ORAL
  Filled 2023-04-10: qty 1

## 2023-04-10 MED ORDER — SODIUM CHLORIDE 0.9 % IV SOLN: INTRAVENOUS | Status: DC

## 2023-04-10 MED ORDER — HYDROMORPHONE HCL 1 MG/ML IJ SOLN
1.0000 mg | INTRAMUSCULAR | Status: DC | PRN
  Administered 2023-04-10 – 2023-04-15 (×13): 1 mg via INTRAVENOUS
  Filled 2023-04-10 (×14): qty 1

## 2023-04-10 MED ORDER — ONDANSETRON HCL 4 MG PO TABS: 4.0000 mg | ORAL_TABLET | Freq: Four times a day (QID) | ORAL | Status: DC | PRN

## 2023-04-10 MED ORDER — IBUPROFEN 400 MG PO TABS
600.0000 mg | ORAL_TABLET | Freq: Three times a day (TID) | ORAL | Status: DC
Start: 2023-04-10 — End: 2023-04-13
  Administered 2023-04-10 – 2023-04-13 (×5): 600 mg via ORAL
  Filled 2023-04-10 (×5): qty 1

## 2023-04-10 MED ORDER — TRAZODONE HCL 50 MG PO TABS
25.0000 mg | ORAL_TABLET | Freq: Every evening | ORAL | Status: DC | PRN
  Administered 2023-04-12: 25 mg via ORAL
  Filled 2023-04-10: qty 1

## 2023-04-10 MED ORDER — MORPHINE SULFATE (PF) 2 MG/ML IV SOLN
2.0000 mg | INTRAVENOUS | Status: DC | PRN
  Administered 2023-04-10 (×5): 2 mg via INTRAVENOUS
  Filled 2023-04-10 (×5): qty 1

## 2023-04-10 NOTE — Plan of Care (Signed)

## 2023-04-10 NOTE — Consult Note (Signed)
Surgical Evaluation Requesting provider: Dr. Teena Dunk  Chief Complaint: Abdominal pain, nausea vomiting  HPI: This is a very pleasant and otherwise healthy 68 year old woman who presented yesterday to the [facility] with severe lower abdominal pain that began on Tuesday and associated fever, nausea, emesis and diarrhea.  She states that actually since Christmas she has been having issues with intermittent diarrhea and crampy lower abdominal pain, but overall was otherwise in good health to the point that she was doing cycling classes and Pilates at the [facility] as recently as this week.  She underwent a CT scan in the emergency room as below which shows a large cystic pelvic mass and hugely distended rectal vault/sigmoid colon with large stool burden.  She has had an NG tube placed which has afforded very little relief, and has been transferred to [facility] for evaluation by the [facility] and general surgery services.  Last colonoscopy was 2017 (Dr. Bosie Clos) and negative, due to repeat in 10 years.  No previous abdominal surgery.  Allergies  Allergen Reactions   Alendronate     Other Reaction(s): heartburn    History reviewed. No pertinent past medical history.  Past Surgical History:  Procedure Laterality Date   FRACTURE SURGERY      Family History  Problem Relation Age of Onset   Breast cancer Neg Hx     Social History   Socioeconomic History   Marital status: Married    Spouse name: Not on file   Number of children: Not on file   Years of education: Not on file   Highest education level: Not on file  Occupational History   Not on file  Tobacco Use   Smoking status: Never   Smokeless tobacco: Not on file  Vaping Use   Vaping status: Never Used  Substance and Sexual Activity   Alcohol use: Never   Drug use: Never   Sexual activity: Not on file  Other Topics Concern   Not on file  Social History Narrative   Not on file   Social Drivers of Health    Financial Resource Strain: Not on file  Food Insecurity: No Food Insecurity (04/10/2023)   Hunger Vital Sign    Worried About Running Out of Food in the Last Year: Never true    Ran Out of Food in the Last Year: Never true  Transportation Needs: No Transportation Needs (04/10/2023)   PRAPARE - Administrator, Civil Service (Medical): No    Lack of Transportation (Non-Medical): No  Physical Activity: Not on file  Stress: Not on file  Social Connections: Moderately Integrated (04/10/2023)   Social Connection and Isolation Panel [NHANES]    Frequency of Communication with Friends and Family: Once a week    Frequency of Social Gatherings with Friends and Family: Once a week    Attends Religious Services: More than 4 times per year    Active Member of [employer] or Organizations: Yes    Attends Engineer, structural: More than 4 times per year    Marital Status: Married    No current facility-administered medications on file prior to encounter.   No current outpatient medications on file prior to encounter.    Review of Systems: a complete, 10pt review of systems was completed with pertinent positives and negatives as documented in the HPI  Physical Exam: Vitals:   04/10/23 1202 04/10/23 1330  BP:  121/75  Pulse:  90  Resp:  15  Temp: 99.4  F (37.4 C) 98.9 F (37.2 C)  SpO2:  97%   Gen: A&Ox3, no distress  Eyes: lids and conjunctivae normal, no icterus. Pupils equally round and reactive to light.  Neck: supple without mass or thyromegaly Chest: respiratory effort is normal. No crepitus or tenderness on palpation of the chest. Breath sounds equal.  Cardiovascular: RRR with palpable distal pulses, no pedal edema Gastrointestinal: soft, distended, tender without peritoneal signs, more on the right side than the left side of the lower abdomen Lymphatic: no lymphadenopathy in the neck or groin Muscoloskeletal: no clubbing or cyanosis of the fingers.  Strength is  symmetrical throughout.  Range of motion of bilateral upper and lower extremities normal without pain, crepitation or contracture. Neuro: cranial nerves grossly intact.  Sensation intact to light touch diffusely. Psych: appropriate mood and affect, normal insight/judgment intact  Skin: warm and dry      Latest Ref Rng & Units 04/09/2023    4:42 PM 08/08/2008    9:17 AM 05/11/2008    9:31 AM  CBC  WBC 4.0 - 10.5 K/uL 16.1     Hemoglobin 12.0 - 15.0 g/dL 16.1  09.6  04.5   Hematocrit 36.0 - 46.0 % 37.7     Platelets 150 - 400 K/uL 284          Latest Ref Rng & Units 04/09/2023    4:42 PM  CMP  Glucose 70 - 99 mg/dL 409   BUN 8 - 23 mg/dL 20   Creatinine 8.11 - 1.00 mg/dL 9.14   Sodium 782 - 956 mmol/L 134   Potassium 3.5 - 5.1 mmol/L 3.5   Chloride 98 - 111 mmol/L 101   CO2 22 - 32 mmol/L 23   Calcium 8.9 - 10.3 mg/dL 9.0   Total Protein 6.5 - 8.1 g/dL 7.1   Total Bilirubin 0.0 - 1.2 mg/dL 0.8   Alkaline Phos 38 - 126 U/L 74   AST 15 - 41 U/L 11   ALT 0 - 44 U/L 13     No results found for: "INR", "PROTIME"  Imaging: DG Abd Portable 1 View Addendum Date: 04/10/2023 ADDENDUM REPORT: 04/09/2023 23:58 ADDENDUM: Additional image was provided after readjustment. Nasogastric tube tip is now in the proximal body of the stomach. Electronically Signed   By: Darliss Cheney M.D.   On: 04/09/2023 23:58   Result Date: 04/09/2023 CLINICAL DATA:  NG tube EXAM: PORTABLE ABDOMEN - 1 VIEW COMPARISON:  Abdominal x-ray 08/03/2009 FINDINGS: NG tube tip is in the proximal stomach with sidehole at the level of the gastroesophageal junction. There is gaseous distention of bowel in the upper abdomen. Visualized lungs are clear. IMPRESSION: NG tube tip is in the proximal stomach with sidehole at the level of the gastroesophageal junction. Recommend advancing tube 7-8 cm. Electronically Signed: By: Darliss Cheney M.D. On: 04/09/2023 22:49   US PELVIC TRANSABD W/PELVIC DOPPLER Result Date:  04/09/2023 CLINICAL DATA:  Cystic structure seen on CT EXAM: TRANSABDOMINAL ULTRASOUND OF PELVIS DOPPLER ULTRASOUND OF OVARIES TECHNIQUE: Transabdominal ultrasound examination of the pelvis was performed including evaluation of the uterus, ovaries, adnexal regions, and pelvic cul-de-sac. Color and duplex Doppler ultrasound was utilized to evaluate blood flow to the ovaries. COMPARISON:  CT 04/09/2023, 08/22/2009 FINDINGS: Uterus Measurements: 6.4 x 3.4 x 4.2 cm = volume: 49 mL. No fibroids or other mass visualized. Uterus is anteriorly displaced by pelvic mass. Endometrium Thickness: 6.6 mm.  No focal abnormality visualized. Right ovary Measurements: 1.1 x 3.0 x 1.5 cm =  volume: 3 mL. Normal appearance/no adnexal mass. Left ovary Not definitively visualized. Large cystic mass in the posterior pelvis/left adnexal region measuring 10.8 x 11.5 x 10.4 cm. This cystic mass is thick-walled with homogeneous low level internal echoes. No vascularity was evident internally or within the walls of the cystic mass. Pulsed Doppler evaluation demonstrates normal low-resistance arterial and venous waveforms in the right ovary. Other: No free fluid is seen within the pelvis. IMPRESSION: 1. Large cystic mass in the posterior pelvis/left adnexal region measuring up to 11.5 cm. This cystic mass is thick-walled with low level internal echoes. No internal vascularity was evident. Findings are most suspicious for cystic neoplasm. Gynecology consultation is recommended. 2. The left ovary was not definitively visualized. 3. Normal sonographic appearance of the uterus and right ovary. Electronically Signed   By: Duanne Guess D.O.   On: 04/09/2023 20:29   CT ABDOMEN PELVIS W CONTRAST Result Date: 04/09/2023 CLINICAL DATA:  Abdominal pain and fever. Nausea vomiting and diarrhea. EXAM: CT ABDOMEN AND PELVIS WITH CONTRAST TECHNIQUE: Multidetector CT imaging of the abdomen and pelvis was performed using the standard protocol following  bolus administration of intravenous contrast. RADIATION DOSE REDUCTION: This exam was performed according to the departmental dose-optimization program which includes automated exposure control, adjustment of the mA and/or kV according to patient size and/or use of iterative reconstruction technique. CONTRAST:  85mL OMNIPAQUE IOHEXOL 300 MG/ML  SOLN COMPARISON:  CT abdomen pelvis dated 08/22/2009. FINDINGS: Lower chest: The visualized lung bases are clear. No intra-abdominal free air or free fluid. Hepatobiliary: The liver is unremarkable. No biliary ductal dilatation. The gallbladder is unremarkable. Pancreas: Unremarkable. No pancreatic ductal dilatation or surrounding inflammatory changes. Spleen: Normal in size without focal abnormality. Adrenals/Urinary Tract: The adrenal glands are unremarkable. Small right renal inferior pole cyst. There is no hydronephrosis on either side. There is symmetric enhancement of the kidneys. The urinary bladder is minimally distended and grossly unremarkable. Stomach/Bowel: There is large amount of stool throughout the colon. There is distension of the rectal vault and sigmoid colon with large amount of stool measuring up to 10 cm in diameter. No evidence of small-bowel obstruction. The appendix is not visualized with certainty. No inflammatory changes identified in the right lower quadrant. Vascular/Lymphatic: Mild aortoiliac atherosclerotic disease. The IVC is unremarkable. No portal venous gas. There is no adenopathy. Reproductive: The uterus is anteverted and poorly visualized. There is a 9 x 11 x 13 cm fluid collection in the pelvis which is not evaluated but may represent a fluid distended vagina or cervical region. Clinical correlation and further evaluation with pelvic ultrasound recommended. Other: Loss of subcutaneous and intra-abdominal fat and cachexia. Musculoskeletal: Osteopenia with multilevel degenerative changes. Right hip arthroplasty. No acute osseous pathology.  IMPRESSION: 1. Large fluid collection or cystic structure within the pelvis may represent hydrocolpos. Clinical correlation and further evaluation with pelvic ultrasound recommended. 2. Distended rectal vault and sigmoid colon with large stool burden. Findings may represent a colonic dysmotility or a degree of obstruction related to compression of the rectum by the pelvic cystic structure. No evidence of small-bowel obstruction. 3.  Aortic Atherosclerosis (ICD10-I70.0). Electronically Signed   By: Elgie Collard M.D.   On: 04/09/2023 18:52     A/P: Obstructing pelvic cystic mass.  I have briefly discussed with the patient that this is most likely going to require surgery and will likely be a coordinated effort between the general surgery service and the gynecology oncology service.  We discussed that she may need a  bowel resection and possibly a colostomy.  Questions were welcomed and answered to her satisfaction.  We will continue to follow closely, but would still keep n.p.o. with NG tube.    Patient Active Problem List   Diagnosis Date Noted   Pelvic mass in female 04/09/2023       Phylliss Blakes, MD [facility]  See AMION to contact appropriate on-call provider

## 2023-04-10 NOTE — ED Notes (Signed)
Called [facility] for Transport spoke with Taryn 11:18a

## 2023-04-10 NOTE — H&P (Signed)
History and Physical  CRAIG WISNEWSKI VWU:[id number] DOB: 1956-02-26 DOA: 04/09/2023  PCP: Jarrett Soho, PA-C   Chief Complaint: Abdominal pain, vomiting  HPI: Tonya Schneider is a 68 y.o. female without significant past medical history being admitted to the hospital with pelvic cystic mass causing abdominal pain and colonic obstruction.  States she was in her usual state of health until about 1 month ago when she started having some crampy lower abdominal pain and intermittent diarrhea and constipation which were unusual for her.  Starting about 3 days ago, she started having much more severe pain, and watery diarrhea.  She had nausea previously, but had not thrown up until a couple of days ago.  She denies any blood in her stool or emesis.  Due to her worsening symptoms, she presented to the ER today, was found on CT to have large fluid collection versus cystic structure within the pelvis causing functional obstruction of the rectum.  She also underwent pelvic ultrasound which showed a large cystic mass in the left adnexal region measuring up to 11 cm.  Findings noted to be suspicious for cystic neoplasm.  ER provider discussed overnight with gynecological oncology, as well as general surgery.  Review of Systems: Please see HPI for pertinent positives and negatives. A complete 10 system review of systems are otherwise negative.  History reviewed. No pertinent past medical history. Past Surgical History:  Procedure Laterality Date   FRACTURE SURGERY     Social History:  reports that she has never smoked. She does not have any smokeless tobacco history on file. She reports that she does not drink alcohol and does not use drugs.  Allergies  Allergen Reactions   Alendronate     Other Reaction(s): heartburn    Family History  Problem Relation Age of Onset   Breast cancer Neg Hx      Prior to Admission medications   Not on File    Physical Exam: BP 121/75 (BP Location:  Right Arm)   Pulse 90   Temp 98.9 F (37.2 C) (Oral)   Resp 15   Wt 60.8 kg   SpO2 97%  General:  Alert, oriented, calm, in no acute distress, thin female appearing her stated age. Cardiovascular: RRR, no murmurs or rubs, no peripheral edema  Respiratory: clear to auscultation bilaterally, no wheezes, no crackles  Abdomen: soft, nontender, firm mass in the lower abdomen, nondistended, normal bowel tones heard  Skin: dry, no rashes  Musculoskeletal: no joint effusions, normal range of motion  Psychiatric: appropriate affect, normal speech  Neurologic: extraocular muscles intact, clear speech, moving all extremities with intact sensorium         Labs on Admission:  Basic Metabolic Panel: Recent Labs  Lab 04/09/23 1642  NA 134*  K 3.5  CL 101  CO2 23  GLUCOSE 147*  BUN 20  CREATININE 0.73  CALCIUM 9.0   Liver Function Tests: Recent Labs  Lab 04/09/23 1642  AST 11*  ALT 13  ALKPHOS 74  BILITOT 0.8  PROT 7.1  ALBUMIN 3.7   Recent Labs  Lab 04/09/23 1642  LIPASE <10*   No results for input(s): "AMMONIA" in the last 168 hours. CBC: Recent Labs  Lab 04/09/23 1642  WBC 16.1*  HGB 12.6  HCT 37.7  MCV 85.7  PLT 284   Cardiac Enzymes: No results for input(s): "CKTOTAL", "CKMB", "CKMBINDEX", "TROPONINI" in the last 168 hours. BNP (last 3 results) No results for input(s): "BNP" in the last 8760 hours.  ProBNP (last 3 results) No results for input(s): "PROBNP" in the last 8760 hours.  CBG: No results for input(s): "GLUCAP" in the last 168 hours.  Radiological Exams on Admission: DG Abd Portable 1 View Addendum Date: 04/10/2023 ADDENDUM REPORT: 04/09/2023 23:58 ADDENDUM: Additional image was provided after readjustment. Nasogastric tube tip is now in the proximal body of the stomach. Electronically Signed   By: Darliss Cheney M.D.   On: 04/09/2023 23:58   Result Date: 04/09/2023 CLINICAL DATA:  NG tube EXAM: PORTABLE ABDOMEN - 1 VIEW COMPARISON:  Abdominal  x-ray 08/03/2009 FINDINGS: NG tube tip is in the proximal stomach with sidehole at the level of the gastroesophageal junction. There is gaseous distention of bowel in the upper abdomen. Visualized lungs are clear. IMPRESSION: NG tube tip is in the proximal stomach with sidehole at the level of the gastroesophageal junction. Recommend advancing tube 7-8 cm. Electronically Signed: By: Darliss Cheney M.D. On: 04/09/2023 22:49   US PELVIC TRANSABD W/PELVIC DOPPLER Result Date: 04/09/2023 CLINICAL DATA:  Cystic structure seen on CT EXAM: TRANSABDOMINAL ULTRASOUND OF PELVIS DOPPLER ULTRASOUND OF OVARIES TECHNIQUE: Transabdominal ultrasound examination of the pelvis was performed including evaluation of the uterus, ovaries, adnexal regions, and pelvic cul-de-sac. Color and duplex Doppler ultrasound was utilized to evaluate blood flow to the ovaries. COMPARISON:  CT 04/09/2023, 08/22/2009 FINDINGS: Uterus Measurements: 6.4 x 3.4 x 4.2 cm = volume: 49 mL. No fibroids or other mass visualized. Uterus is anteriorly displaced by pelvic mass. Endometrium Thickness: 6.6 mm.  No focal abnormality visualized. Right ovary Measurements: 1.1 x 3.0 x 1.5 cm = volume: 3 mL. Normal appearance/no adnexal mass. Left ovary Not definitively visualized. Large cystic mass in the posterior pelvis/left adnexal region measuring 10.8 x 11.5 x 10.4 cm. This cystic mass is thick-walled with homogeneous low level internal echoes. No vascularity was evident internally or within the walls of the cystic mass. Pulsed Doppler evaluation demonstrates normal low-resistance arterial and venous waveforms in the right ovary. Other: No free fluid is seen within the pelvis. IMPRESSION: 1. Large cystic mass in the posterior pelvis/left adnexal region measuring up to 11.5 cm. This cystic mass is thick-walled with low level internal echoes. No internal vascularity was evident. Findings are most suspicious for cystic neoplasm. Gynecology consultation is  recommended. 2. The left ovary was not definitively visualized. 3. Normal sonographic appearance of the uterus and right ovary. Electronically Signed   By: Duanne Guess D.O.   On: 04/09/2023 20:29   CT ABDOMEN PELVIS W CONTRAST Result Date: 04/09/2023 CLINICAL DATA:  Abdominal pain and fever. Nausea vomiting and diarrhea. EXAM: CT ABDOMEN AND PELVIS WITH CONTRAST TECHNIQUE: Multidetector CT imaging of the abdomen and pelvis was performed using the standard protocol following bolus administration of intravenous contrast. RADIATION DOSE REDUCTION: This exam was performed according to the departmental dose-optimization program which includes automated exposure control, adjustment of the mA and/or kV according to patient size and/or use of iterative reconstruction technique. CONTRAST:  85mL OMNIPAQUE IOHEXOL 300 MG/ML  SOLN COMPARISON:  CT abdomen pelvis dated 08/22/2009. FINDINGS: Lower chest: The visualized lung bases are clear. No intra-abdominal free air or free fluid. Hepatobiliary: The liver is unremarkable. No biliary ductal dilatation. The gallbladder is unremarkable. Pancreas: Unremarkable. No pancreatic ductal dilatation or surrounding inflammatory changes. Spleen: Normal in size without focal abnormality. Adrenals/Urinary Tract: The adrenal glands are unremarkable. Small right renal inferior pole cyst. There is no hydronephrosis on either side. There is symmetric enhancement of the kidneys. The urinary bladder is  minimally distended and grossly unremarkable. Stomach/Bowel: There is large amount of stool throughout the colon. There is distension of the rectal vault and sigmoid colon with large amount of stool measuring up to 10 cm in diameter. No evidence of small-bowel obstruction. The appendix is not visualized with certainty. No inflammatory changes identified in the right lower quadrant. Vascular/Lymphatic: Mild aortoiliac atherosclerotic disease. The IVC is unremarkable. No portal venous gas. There  is no adenopathy. Reproductive: The uterus is anteverted and poorly visualized. There is a 9 x 11 x 13 cm fluid collection in the pelvis which is not evaluated but may represent a fluid distended vagina or cervical region. Clinical correlation and further evaluation with pelvic ultrasound recommended. Other: Loss of subcutaneous and intra-abdominal fat and cachexia. Musculoskeletal: Osteopenia with multilevel degenerative changes. Right hip arthroplasty. No acute osseous pathology. IMPRESSION: 1. Large fluid collection or cystic structure within the pelvis may represent hydrocolpos. Clinical correlation and further evaluation with pelvic ultrasound recommended. 2. Distended rectal vault and sigmoid colon with large stool burden. Findings may represent a colonic dysmotility or a degree of obstruction related to compression of the rectum by the pelvic cystic structure. No evidence of small-bowel obstruction. 3.  Aortic Atherosclerosis (ICD10-I70.0). Electronically Signed   By: Elgie Collard M.D.   On: 04/09/2023 18:52   Assessment/Plan SUZETTA TIMKO is a 68 y.o. female without significant past medical history being admitted to the hospital with pelvic cystic mass causing abdominal pain and colonic obstruction.  Abdominal pain and nausea-due to large 11 cm cystic pelvic mass.  Also causing functional obstruction of the distal colon. -Inpatient admission -Keep n.p.o. -Pain and nausea control -NG tube has been placed per general surgery recommendation -Discussed with gynecological oncologist Dr. Tamela Oddi, who will see this afternoon -Discussed with general surgery team who will consult this afternoon  Leukocytosis-I suspect this is reactive, I have low suspicion for acute bacterial infection at this time  DVT prophylaxis: Lovenox     Code Status: Full Code  Consults called: GynOnc, general surgery  Admission status: The appropriate patient status for this patient is INPATIENT. Inpatient  status is judged to be reasonable and necessary in order to provide the required intensity of service to ensure the patient's safety. The patient's presenting symptoms, physical exam findings, and initial radiographic and laboratory data in the context of their chronic comorbidities is felt to place them at high risk for further clinical deterioration. Furthermore, it is not anticipated that the patient will be medically stable for discharge from the hospital within 2 midnights of admission.    I certify that at the point of admission it is my clinical judgment that the patient will require inpatient hospital care spanning beyond 2 midnights from the point of admission due to high intensity of service, high risk for further deterioration and high frequency of surveillance required  Time spent: 59 minutes  Georganne Siple Sharlette Dense MD [facility] Pager [phone]  If 7PM-7AM, please contact night-coverage [email or web address] Password [email or web address]  04/10/2023, 2:30 PM

## 2023-04-10 NOTE — Consult Note (Addendum)
Gynecologic Oncology Consultation  Tonya Schneider 68 y.o. female  CC:  Chief Complaint  Patient presents with   Abdominal Pain    HPI: Tonya Schneider is a 68 year old female who presented to the [facility] ER on 04/09/2023 with severe lower abdominal pain for 3 days, fever, nausea/emesis/diarrhea. She had seen her PCP and was advised to seek care in the ER for further evaluation. With review, the patient reported having similar symptoms since Christmas with symptoms now worsening. The patient has been experiencing severe abdominal pain since Tuesday, accompanied by abdominal distension. The pain worsens with eating, leading her to cease eating since Wednesday. Despite this, she has not experienced significant weight loss over the past couple of months, and her weight remains stable.She has experienced low-grade fevers, with her normal temperature being around 38F. She noted one instance of a fever reaching 100.70F, which is unusually high for her. She has no history of similar abdominal issues.  In the ER, a CT AP with contrast was performed on 04/09/23 returning with: 1. Large fluid collection or cystic structure within the pelvis may represent hydrocolpos. Clinical correlation and further evaluation with pelvic ultrasound recommended. 2. Distended rectal vault and sigmoid colon with large stool burden. Findings may represent a colonic dysmotility or a degree of obstruction related to compression of the rectum by the pelvic cystic structure. No evidence of small-bowel obstruction. 3.  Aortic Atherosclerosis.   She also underwent a pelvic ultrasound resulting: 1. Large cystic mass in the posterior pelvis/left adnexal region measuring up to 11.5 cm. This cystic mass is thick-walled with low level internal echoes. No internal vascularity was evident. Findings are most suspicious for cystic neoplasm. Gynecology consultation is recommended. 2. The left ovary was not definitively  visualized. 3. Normal sonographic appearance of the uterus and right ovary.  Labs in the ER included: WBC 16.1 on CBC with no other abnormalities, Cmet with Na+ at 134 and glucose 147, UA with protein/small amount hgb.  General Surgery was consulted with recommendations for NG tube placement.  Her past medical history reviewed in Care Everywhere including osteoporosis, HSV 1 positive using valtrex, situational depressive mood, vitamin D deficiency,     Colon cancer screening 2017 Dr. Bosie Clos, int hem, repeat 10 yrs. Cervical cancer screening 2020 nl hpv neg, d/c due to age. Breast Cancer screening 04/2020. Last DEXA scan 2022 osteoporosis, declines med start. Last A1C 01/2023 was 5.9.   Surgical History: Left CTR by Dr. Merlyn Lot 3/2010Right CTR by Dr. Merlyn Lot 7/2010diskectomy at L5-S1 by Dr. Ricki Miller 1998colonoscopy 09/2005,11/2017left hip arthroplasty 09/2017.  Family History: Father: deceased 53 yrs, MI, heavy alcohol and tobacco use, diagnosed with Hypertension. Mother: deceased 18 yrs, heavy smoker, diagnosed with Lung Cancer, Hypertension, Breast cancer. Paternal Grand Father: deceased. Paternal Grand Mother: deceased. Maternal Grand Father: deceased. Maternal Grand Mother: deceased, lung cancer. Brother 1: alive 88 yrs, skin cancer, wpw syndrome. Brother 2: alive 56 yrs. No family history of colon cancer,colon polyps,or liver disease.   General:         Tobacco use           cigarettes: Never smoked          Tobacco history last updated 01/27/2023       EXPOSURE TO PASSIVE SMOKE: no.       Alcohol: no.       Caffeine: yes.       Recreational drug use: no.       DIET: no.  Exercise: yes.       Marital Status: Divorced.       Children: none.       EDUCATION: Some College.       OCCUPATION: unemployed, retired from [employer]   Current Meds: Current inpatient and outpatient meds reviewed  Allergy:  Allergies  Allergen Reactions   Alendronate     Other Reaction(s):  heartburn    Social Hx:   Social History   Socioeconomic History   Marital status: Married    Spouse name: Not on file   Number of children: Not on file   Years of education: Not on file   Highest education level: Not on file  Occupational History   Not on file  Tobacco Use   Smoking status: Never   Smokeless tobacco: Not on file  Vaping Use   Vaping status: Never Used  Substance and Sexual Activity   Alcohol use: Never   Drug use: Never   Sexual activity: Not on file  Other Topics Concern   Not on file  Social History Narrative   Not on file   Social Drivers of Health   Financial Resource Strain: Not on file  Food Insecurity: No Food Insecurity (04/10/2023)   Hunger Vital Sign    Worried About Running Out of Food in the Last Year: Never true    Ran Out of Food in the Last Year: Never true  Transportation Needs: No Transportation Needs (04/10/2023)   PRAPARE - Administrator, Civil Service (Medical): No    Lack of Transportation (Non-Medical): No  Physical Activity: Not on file  Stress: Not on file  Social Connections: Moderately Integrated (04/10/2023)   Social Connection and Isolation Panel [NHANES]    Frequency of Communication with Friends and Family: Once a week    Frequency of Social Gatherings with Friends and Family: Once a week    Attends Religious Services: More than 4 times per year    Active Member of [employer] or Organizations: Yes    Attends Banker Meetings: More than 4 times per year    Marital Status: Married  Catering manager Violence: Not At Risk (04/10/2023)   Humiliation, Afraid, Rape, and Kick questionnaire    Fear of Current or Ex-Partner: No    Emotionally Abused: No    Physically Abused: No    Sexually Abused: No    Past Surgical Hx:  Past Surgical History:  Procedure Laterality Date   FRACTURE SURGERY      Past Medical Hx: History reviewed. No pertinent past medical history.  Family Hx:  Family History   Problem Relation Age of Onset   Breast cancer Neg Hx     Vitals:  Blood pressure 121/75, pulse 90, temperature 98.9 F (37.2 C), temperature source Oral, resp. rate 15, weight 134 lb (60.8 kg), SpO2 97%.  Physical Exam:  General: moderate distress Abdomen: round, distended to the level of the umbilicus; RLQ tendeness Ext: no c/c/e  Assessment/Plan:  68 year old female currently admitted with abdominal pain, GI sxs and fever.  She was found to have a newly diagnosed cystic, pelvic mass on imaging   Imaging was also remarkable for a large stool burden.  Large bowel obstructive process 2/2 to the mass is less likely. No findings suggestive of an advanced EOC.  DDx includes a primary GI process, adnexal torsion. -Consult by general surgery pending -Recommend obtaining tumor markers--CEA, CA 125 -Serial exams/labs -Will follow w/the team  Antionette Char, MD 04/10/2023, 3:03 PM

## 2023-04-10 NOTE — ED Notes (Signed)
Report given to the floor RN.

## 2023-04-11 ENCOUNTER — Encounter (HOSPITAL_COMMUNITY): Payer: Self-pay | Admitting: Internal Medicine

## 2023-04-11 DIAGNOSIS — K56609 Unspecified intestinal obstruction, unspecified as to partial versus complete obstruction: Secondary | ICD-10-CM | POA: Diagnosis not present

## 2023-04-11 DIAGNOSIS — K56600 Partial intestinal obstruction, unspecified as to cause: Secondary | ICD-10-CM | POA: Diagnosis not present

## 2023-04-11 DIAGNOSIS — R1900 Intra-abdominal and pelvic swelling, mass and lump, unspecified site: Secondary | ICD-10-CM | POA: Diagnosis not present

## 2023-04-11 LAB — BASIC METABOLIC PANEL
Anion gap: 10 (ref 5–15)
BUN: 21 mg/dL (ref 8–23)
CO2: 23 mmol/L (ref 22–32)
Calcium: 8.4 mg/dL — ABNORMAL LOW (ref 8.9–10.3)
Chloride: 108 mmol/L (ref 98–111)
Creatinine, Ser: 0.68 mg/dL (ref 0.44–1.00)
GFR, Estimated: >60 mL/min (ref >60)
Glucose, Bld: 88 mg/dL (ref 70–99)
Potassium: 3.4 mmol/L — ABNORMAL LOW (ref 3.5–5.1)
Sodium: 141 mmol/L (ref 135–145)

## 2023-04-11 LAB — CBC
HCT: 34.7 % — ABNORMAL LOW (ref 36.0–46.0)
Hemoglobin: 11.1 g/dL — ABNORMAL LOW (ref 12.0–15.0)
MCH: 29.2 pg (ref 26.0–34.0)
MCHC: 32 g/dL (ref 30.0–36.0)
MCV: 91.3 fL (ref 80.0–100.0)
Platelets: 252 10*3/uL (ref 150–400)
RBC: 3.8 MIL/uL — ABNORMAL LOW (ref 3.87–5.11)
RDW: 13.8 % (ref 11.5–15.5)
WBC: 9.8 10*3/uL (ref 4.0–10.5)
nRBC: 0 % (ref 0.0–0.2)

## 2023-04-11 MED ORDER — FLEET ENEMA RE ENEM
1.0000 | ENEMA | Freq: Once | RECTAL | Status: AC
  Administered 2023-04-11: 1 via RECTAL
  Filled 2023-04-11: qty 1

## 2023-04-11 MED ORDER — PROCHLORPERAZINE EDISYLATE 10 MG/2ML IJ SOLN
5.0000 mg | Freq: Four times a day (QID) | INTRAMUSCULAR | Status: DC | PRN
  Administered 2023-04-11: 5 mg via INTRAVENOUS
  Filled 2023-04-11: qty 2

## 2023-04-11 MED ORDER — POTASSIUM CHLORIDE IN NACL 40-0.9 MEQ/L-% IV SOLN
INTRAVENOUS | Status: DC
  Filled 2023-04-11: qty 1000

## 2023-04-11 MED ORDER — POLYETHYLENE GLYCOL 3350 17 G PO PACK: 17.0000 g | PACK | Freq: Two times a day (BID) | ORAL | Status: DC

## 2023-04-11 MED ORDER — MAGNESIUM HYDROXIDE 400 MG/5ML PO SUSP
15.0000 mL | Freq: Once | ORAL | Status: AC
  Administered 2023-04-11: 15 mL
  Filled 2023-04-11: qty 30

## 2023-04-11 MED ORDER — KCL IN DEXTROSE-NACL 20-5-0.45 MEQ/L-%-% IV SOLN
INTRAVENOUS | Status: AC
  Filled 2023-04-11 (×2): qty 1000

## 2023-04-11 NOTE — Progress Notes (Signed)
Inpatient Gyn Onc Progress Note  Subjective: Patient reports that she is stable. Nausea comes and goes. Pain comes and goes. No real change in her symptoms with the NGT. Reports some flatus overnight and maybe some small loose stool with this episode. Last had some diarrhea about 2 days ago. Last had a formed stool over a week ago. Pain is currently at a 6 but flairs at times. Pain is described at crampy, stabbing.    Objective: Vital signs in last 24 hours: Temp:  [97.8 F (36.6 C)-99.4 F (37.4 C)] 97.8 F (36.6 C) (02/01 0930) Pulse Rate:  [78-98] 89 (02/01 0930) Resp:  [15-19] 16 (02/01 0930) BP: (107-131)/(63-80) 131/74 (02/01 0930) SpO2:  [93 %-97 %] 96 % (02/01 0930) Last BM Date : 04/09/23  Intake/Output from previous day: 01/31 0701 - 02/01 0700 In: 1275.1 [P.O.:60; I.V.:1215.1] Out: 600 [Urine:600]  Physical Examination: General: Alert, oriented, no acute distress. HEENT: Normocephalic, atraumatic. Neck symmetric without masses. Sclera anicteric. NGT in place with small volume bilious output Chest: Normal work of breathing. Clear to auscultation bilaterally.   Cardiovascular: Regular rate and rhythm, no murmurs. Abdomen: Distended, rounded, tympanitic. Mild to moderate TTP.  Decreased bowel sounds. Extremities: Grossly normal range of motion.  Warm, well perfused.  No edema bilaterally. SCDs in place Skin: No rashes or lesions noted. GU: Normal appearing external genitalia without erythema, excoriation, or lesions.   Bimanual exam reveals uterus, cervix pulled cephalad and unable to palpate. Posterior vaginal wall deviated anteriorly due to mass filling posterior cul-de-sac.  =Rectovaginal exam mass filling rectovaginal septum and deviating rectum. No nodularity or mass within rectum palpated. Likely unable to reach to point of most compression of rectum. Exam chaperoned by Dianna, NT.  Labs: WBC/Hgb/Hct/Plts:  9.8/11.1/34.7/252 (02/01 9604) BUN/Cr/glu/ALT/AST/amyl/lip:   21/0.68/--/--/--/--/-- (02/01 5409)  Assessment: 68 y.o. a 13cm cystic pelvic mass that appears to be causing external compression on the rectal leading to a partial obstructive process and obstipation. Patient with flatus overnight. Has had loose stools up until about 2 days ago, maybe some loose stool with gas overnight. No other signs on imaging of a metastatic process at this time. Tumor markers not yet collected. NGT in place with small volume output at this time. Discussed with patient that her pelvic mass will likely need to be removed to help alleviate the compression on her colon. Unclear at this time if this is a benign, borderline or malignant process.  We discussed the likely following surgery: total abdominal hysterectomy, bilateral salpingo-oophorectomy, possible staging/debulking, possible bowel surgery, possible ostomy.  The risks of surgery were discussed in detail and she understands these to including but not limited to bleeding requiring a blood transfusion, infection, injury to adjacent organs (including but not limited to the bowels, bladder, ureters, nerves, blood vessels), thromboembolic events, wound separation, hernia, vaginal cuff separation, possible risk of lymphedema and lymphocyst if lymphadenectomy performed, unforseen complication, possible need for re-exploration, and possible permanent ostomy.  If the patient experiences any of these events, she understands that her hospitalization or recovery may be prolonged and that she may need to take additional medications for a prolonged period. The patient will receive DVT and antibiotic prophylaxis as indicated. She voiced a clear understanding. She had the opportunity to ask questions and verbal consent was obtained today.  Recommendations: - Recommend CA125, CEA. I have ordered these to be collected - Recommend attempt at alleviating her obstipation. Given that she is passing gas, not consistent with a complete obstructive  process. Recommend  milk of magnesia, enema. If moving stool, continue with miralax. - I will examine available OR time for this week. If able to move some stool, this would be ideal prior to surgical intervention in case bowel surgery needed.     LOS: 1 day    Tonya Schneider 04/11/2023, 9:35 AM

## 2023-04-11 NOTE — Plan of Care (Signed)
  Problem: Education: Goal: Knowledge of General Education information will improve Description: Including pain rating scale, medication(s)/side effects and non-pharmacologic comfort measures Outcome: Progressing   Problem: Health Behavior/Discharge Planning: Goal: Ability to manage health-related needs will improve Outcome: Progressing   Problem: Clinical Measurements: Goal: Ability to maintain clinical measurements within normal limits will improve Outcome: Progressing Goal: Will remain free from infection Outcome: Progressing Goal: Diagnostic test results will improve Outcome: Progressing Goal: Respiratory complications will improve Outcome: Progressing Goal: Cardiovascular complication will be avoided Outcome: Progressing   Problem: Activity: Goal: Risk for activity intolerance will decrease Outcome: Progressing   Problem: Pain Managment: Goal: General experience of comfort will improve and/or be controlled Outcome: Progressing   Problem: Safety: Goal: Ability to remain free from injury will improve Outcome: Progressing

## 2023-04-11 NOTE — Progress Notes (Signed)
[facility] PROGRESS NOTE    Progress Note  Tonya Schneider  BJY:[id number] DOB: June 15, 1955 DOA: 04/09/2023 PCP: Jarrett Soho, PA-C     Brief Narrative:   Tonya Schneider is an 68 y.o. female past medical history not significant from sent to the hospital for abdominal pain and colon obstruction, she related started about a month prior to admission with having crampy lower abdominal pain with intermittent diarrhea and constipation 3 days prior to admission she started having severe pain and ongoing watery diarrhea.  Denies any hematochezia in the ED, CT scan of the abdomen pelvis showed large fluid collection about 11 cm, vaginal ultrasound confirmed large cystic mass of the left adnexal region measuring about 11 cm   Assessment/Plan:   Abdominal pain and nausea likely due to 11 cm left cystic  Pelvic mass in female, Also causing distal obstruction of the colon: Patient was placed in p.o., NG tube was placed for intermittent suction. General surgery was consulted and a long conversation with the patient and related that this was most likely required surgical intervention in conjunction with general surgery and GYN. GYN oncology  Dr. Jean Rosenthal more was consulted there is no evidence of EOC CEA pending CA125 pending General surgery and GYN to dictate management.  Colonic obstruction mechanical due to left cystic mass: Currently with an NG tube. She relates her abdominal pain is slightly improved after NG tube placement.  Leukocytosis: Likely reactive improved after NG tube placement.    DVT prophylaxis: lovenox Family Communication:non Status is: Inpatient Remains inpatient appropriate because: Left cystic pelvic mass    Code Status:     Code Status Orders  (From admission, onward)           Start     Ordered   04/10/23 1338  Full code  Continuous       Question:  By:  Answer:  Consent: discussion documented in EHR   04/10/23 1338            Code Status History     This patient has a current code status but no historical code status.      Advance Directive Documentation    Flowsheet Row Most Recent Value  Type of Advance Directive Healthcare Power of Attorney, Living will  Pre-existing out of facility DNR order (yellow form or pink MOST form) --  "MOST" Form in Place? --         IV Access:   Peripheral IV   Procedures and diagnostic studies:   DG Abd Portable 1 View Addendum Date: 04/10/2023 ADDENDUM REPORT: 04/09/2023 23:58 ADDENDUM: Additional image was provided after readjustment. Nasogastric tube tip is now in the proximal body of the stomach. Electronically Signed   By: Darliss Cheney M.D.   On: 04/09/2023 23:58   Result Date: 04/09/2023 CLINICAL DATA:  NG tube EXAM: PORTABLE ABDOMEN - 1 VIEW COMPARISON:  Abdominal x-ray 08/03/2009 FINDINGS: NG tube tip is in the proximal stomach with sidehole at the level of the gastroesophageal junction. There is gaseous distention of bowel in the upper abdomen. Visualized lungs are clear. IMPRESSION: NG tube tip is in the proximal stomach with sidehole at the level of the gastroesophageal junction. Recommend advancing tube 7-8 cm. Electronically Signed: By: Darliss Cheney M.D. On: 04/09/2023 22:49   US PELVIC TRANSABD W/PELVIC DOPPLER Result Date: 04/09/2023 CLINICAL DATA:  Cystic structure seen on CT EXAM: TRANSABDOMINAL ULTRASOUND OF PELVIS DOPPLER ULTRASOUND OF OVARIES TECHNIQUE: Transabdominal ultrasound examination of the pelvis was performed including  evaluation of the uterus, ovaries, adnexal regions, and pelvic cul-de-sac. Color and duplex Doppler ultrasound was utilized to evaluate blood flow to the ovaries. COMPARISON:  CT 04/09/2023, 08/22/2009 FINDINGS: Uterus Measurements: 6.4 x 3.4 x 4.2 cm = volume: 49 mL. No fibroids or other mass visualized. Uterus is anteriorly displaced by pelvic mass. Endometrium Thickness: 6.6 mm.  No focal abnormality visualized. Right ovary  Measurements: 1.1 x 3.0 x 1.5 cm = volume: 3 mL. Normal appearance/no adnexal mass. Left ovary Not definitively visualized. Large cystic mass in the posterior pelvis/left adnexal region measuring 10.8 x 11.5 x 10.4 cm. This cystic mass is thick-walled with homogeneous low level internal echoes. No vascularity was evident internally or within the walls of the cystic mass. Pulsed Doppler evaluation demonstrates normal low-resistance arterial and venous waveforms in the right ovary. Other: No free fluid is seen within the pelvis. IMPRESSION: 1. Large cystic mass in the posterior pelvis/left adnexal region measuring up to 11.5 cm. This cystic mass is thick-walled with low level internal echoes. No internal vascularity was evident. Findings are most suspicious for cystic neoplasm. Gynecology consultation is recommended. 2. The left ovary was not definitively visualized. 3. Normal sonographic appearance of the uterus and right ovary. Electronically Signed   By: Duanne Guess D.O.   On: 04/09/2023 20:29   CT ABDOMEN PELVIS W CONTRAST Result Date: 04/09/2023 CLINICAL DATA:  Abdominal pain and fever. Nausea vomiting and diarrhea. EXAM: CT ABDOMEN AND PELVIS WITH CONTRAST TECHNIQUE: Multidetector CT imaging of the abdomen and pelvis was performed using the standard protocol following bolus administration of intravenous contrast. RADIATION DOSE REDUCTION: This exam was performed according to the departmental dose-optimization program which includes automated exposure control, adjustment of the mA and/or kV according to patient size and/or use of iterative reconstruction technique. CONTRAST:  85mL OMNIPAQUE IOHEXOL 300 MG/ML  SOLN COMPARISON:  CT abdomen pelvis dated 08/22/2009. FINDINGS: Lower chest: The visualized lung bases are clear. No intra-abdominal free air or free fluid. Hepatobiliary: The liver is unremarkable. No biliary ductal dilatation. The gallbladder is unremarkable. Pancreas: Unremarkable. No pancreatic  ductal dilatation or surrounding inflammatory changes. Spleen: Normal in size without focal abnormality. Adrenals/Urinary Tract: The adrenal glands are unremarkable. Small right renal inferior pole cyst. There is no hydronephrosis on either side. There is symmetric enhancement of the kidneys. The urinary bladder is minimally distended and grossly unremarkable. Stomach/Bowel: There is large amount of stool throughout the colon. There is distension of the rectal vault and sigmoid colon with large amount of stool measuring up to 10 cm in diameter. No evidence of small-bowel obstruction. The appendix is not visualized with certainty. No inflammatory changes identified in the right lower quadrant. Vascular/Lymphatic: Mild aortoiliac atherosclerotic disease. The IVC is unremarkable. No portal venous gas. There is no adenopathy. Reproductive: The uterus is anteverted and poorly visualized. There is a 9 x 11 x 13 cm fluid collection in the pelvis which is not evaluated but may represent a fluid distended vagina or cervical region. Clinical correlation and further evaluation with pelvic ultrasound recommended. Other: Loss of subcutaneous and intra-abdominal fat and cachexia. Musculoskeletal: Osteopenia with multilevel degenerative changes. Right hip arthroplasty. No acute osseous pathology. IMPRESSION: 1. Large fluid collection or cystic structure within the pelvis may represent hydrocolpos. Clinical correlation and further evaluation with pelvic ultrasound recommended. 2. Distended rectal vault and sigmoid colon with large stool burden. Findings may represent a colonic dysmotility or a degree of obstruction related to compression of the rectum by the pelvic cystic structure. No  evidence of small-bowel obstruction. 3.  Aortic Atherosclerosis (ICD10-I70.0). Electronically Signed   By: Elgie Collard M.D.   On: 04/09/2023 18:52     Medical Consultants:   None.   Subjective:    Tonya Schneider relates her  abdominal pain is slightly improved but she still has it.  Objective:    Vitals:   04/10/23 1737 04/10/23 2114 04/11/23 0123 04/11/23 0612  BP: 130/80 122/77 107/66 127/75  Pulse: 97 98 78 89  Resp:  18 18 19   Temp:  98.9 F (37.2 C) 98.5 F (36.9 C) 98.4 F (36.9 C)  TempSrc:  Oral Oral Oral  SpO2: 94% 96% 96% 95%  Weight:       SpO2: 95 %   Intake/Output Summary (Last 24 hours) at 04/11/2023 0825 Last data filed at 04/11/2023 0317 Gross per 24 hour  Intake 1275.08 ml  Output 600 ml  Net 675.08 ml   Filed Weights   04/09/23 1636  Weight: 60.8 kg    Exam: General exam: In no acute distress. Respiratory system: Good air movement and clear to auscultation. Cardiovascular system: S1 & S2 heard, RRR. No JVD. Gastrointestinal system: Abdomen is nondistended, soft and nontender.  Extremities: No pedal edema. Skin: No rashes, lesions or ulcers Psychiatry: Judgement and insight appear normal. Mood & affect appropriate.    Data Reviewed:    Labs: Basic Metabolic Panel: Recent Labs  Lab 04/09/23 1642 04/11/23 0528  NA 134* 141  K 3.5 3.4*  CL 101 108  CO2 23 23  GLUCOSE 147* 88  BUN 20 21  CREATININE 0.73 0.68  CALCIUM 9.0 8.4*   GFR CrCl cannot be calculated (Unknown ideal weight.). Liver Function Tests: Recent Labs  Lab 04/09/23 1642  AST 11*  ALT 13  ALKPHOS 74  BILITOT 0.8  PROT 7.1  ALBUMIN 3.7   Recent Labs  Lab 04/09/23 1642  LIPASE <10*   No results for input(s): "AMMONIA" in the last 168 hours. Coagulation profile No results for input(s): "INR", "PROTIME" in the last 168 hours. COVID-19 Labs  No results for input(s): "DDIMER", "FERRITIN", "LDH", "CRP" in the last 72 hours.  No results found for: "SARSCOV2NAA"  CBC: Recent Labs  Lab 04/09/23 1642 04/11/23 0528  WBC 16.1* 9.8  HGB 12.6 11.1*  HCT 37.7 34.7*  MCV 85.7 91.3  PLT 284 252   Cardiac Enzymes: No results for input(s): "CKTOTAL", "CKMB", "CKMBINDEX", "TROPONINI" in  the last 168 hours. BNP (last 3 results) No results for input(s): "PROBNP" in the last 8760 hours. CBG: No results for input(s): "GLUCAP" in the last 168 hours. D-Dimer: No results for input(s): "DDIMER" in the last 72 hours. Hgb A1c: No results for input(s): "HGBA1C" in the last 72 hours. Lipid Profile: No results for input(s): "CHOL", "HDL", "LDLCALC", "TRIG", "CHOLHDL", "LDLDIRECT" in the last 72 hours. Thyroid function studies: No results for input(s): "TSH", "T4TOTAL", "T3FREE", "THYROIDAB" in the last 72 hours.  Invalid input(s): "FREET3" Anemia work up: No results for input(s): "VITAMINB12", "FOLATE", "FERRITIN", "TIBC", "IRON", "RETICCTPCT" in the last 72 hours. Sepsis Labs: Recent Labs  Lab 04/09/23 1642 04/11/23 0528  WBC 16.1* 9.8   Microbiology No results found for this or any previous visit (from the past 240 hours).   Medications:    enoxaparin (LOVENOX) injection  40 mg Subcutaneous Q24H   ibuprofen  600 mg Oral TID   Continuous Infusions:  sodium chloride 100 mL/hr at 04/11/23 0306      LOS: 1 day  Marinda Elk  [facility]  04/11/2023, 8:25 AM

## 2023-04-11 NOTE — Progress Notes (Signed)
Mobility Specialist - Progress Note   04/11/23 1038  Mobility  Activity Ambulated with assistance in hallway  Level of Assistance Contact guard assist, steadying assist  Assistive Device Other (Comment) (IV Pole)  Distance Ambulated (ft) 480 ft  Activity Response Tolerated well  Mobility Referral Yes  Mobility visit 1 Mobility  Mobility Specialist Start Time (ACUTE ONLY) 1005  Mobility Specialist Stop Time (ACUTE ONLY) 1036  Mobility Specialist Time Calculation (min) (ACUTE ONLY) 31 min   Pt received in bed and agreeable to mobility. Pt CG during ambulation d/t unsteadiness. Upon returning to room, pt c/o feeling nauseous. RN in room during encounter. No other complaints during session. Pt to recliner after session with all needs met.    [facility]

## 2023-04-11 NOTE — Plan of Care (Signed)
Noted Nausea and vomiting; Dr Robb Matar notified. Antiemetic given with good relief.  5:39 PM Also noted with urinary urgency post void residual 470. Foley ordered and placed as per MD order.  Problem: Education: Goal: Knowledge of General Education information will improve Description: Including pain rating scale, medication(s)/side effects and non-pharmacologic comfort measures Outcome: Progressing   Problem: Health Behavior/Discharge Planning: Goal: Ability to manage health-related needs will improve Outcome: Progressing   Problem: Clinical Measurements: Goal: Ability to maintain clinical measurements within normal limits will improve Outcome: Progressing Goal: Will remain free from infection Outcome: Progressing Goal: Diagnostic test results will improve Outcome: Progressing Goal: Respiratory complications will improve Outcome: Progressing Goal: Cardiovascular complication will be avoided Outcome: Progressing   Problem: Activity: Goal: Risk for activity intolerance will decrease Outcome: Progressing   Problem: Nutrition: Goal: Adequate nutrition will be maintained Outcome: Progressing   Problem: Coping: Goal: Level of anxiety will decrease Outcome: Progressing   Problem: Elimination: Goal: Will not experience complications related to bowel motility Outcome: Progressing Goal: Will not experience complications related to urinary retention Outcome: Progressing   Problem: Pain Managment: Goal: General experience of comfort will improve and/or be controlled Outcome: Progressing   Problem: Safety: Goal: Ability to remain free from injury will improve Outcome: Progressing   Problem: Skin Integrity: Goal: Risk for impaired skin integrity will decrease Outcome: Progressing

## 2023-04-12 ENCOUNTER — Inpatient Hospital Stay (HOSPITAL_COMMUNITY): Payer: Medicare HMO

## 2023-04-12 DIAGNOSIS — K56600 Partial intestinal obstruction, unspecified as to cause: Secondary | ICD-10-CM | POA: Diagnosis not present

## 2023-04-12 DIAGNOSIS — R1900 Intra-abdominal and pelvic swelling, mass and lump, unspecified site: Secondary | ICD-10-CM | POA: Diagnosis not present

## 2023-04-12 DIAGNOSIS — R978 Other abnormal tumor markers: Secondary | ICD-10-CM | POA: Diagnosis not present

## 2023-04-12 LAB — CEA: CEA: 14.8 ng/mL — ABNORMAL HIGH (ref 0.0–4.7)

## 2023-04-12 LAB — CA 125: Cancer Antigen (CA) 125: 24.6 U/mL (ref 0.0–38.1)

## 2023-04-12 MED ORDER — MAGNESIUM HYDROXIDE 400 MG/5ML PO SUSP
30.0000 mL | Freq: Once | ORAL | Status: AC
  Administered 2023-04-12: 30 mL
  Filled 2023-04-12: qty 30

## 2023-04-12 MED ORDER — CHLORHEXIDINE GLUCONATE CLOTH 2 % EX PADS
6.0000 | MEDICATED_PAD | Freq: Every day | CUTANEOUS | Status: DC
  Administered 2023-04-12 – 2023-04-16 (×5): 6 via TOPICAL

## 2023-04-12 MED ORDER — CARMEX CLASSIC LIP BALM EX OINT
TOPICAL_OINTMENT | CUTANEOUS | Status: DC | PRN
  Filled 2023-04-12: qty 10

## 2023-04-12 MED ORDER — POLYETHYLENE GLYCOL 3350 17 G PO PACK
17.0000 g | PACK | Freq: Two times a day (BID) | ORAL | Status: DC
  Administered 2023-04-12 – 2023-04-16 (×5): 17 g via NASOGASTRIC
  Filled 2023-04-12 (×6): qty 1

## 2023-04-12 MED ORDER — GADOBUTROL 1 MMOL/ML IV SOLN
6.0000 mL | Freq: Once | INTRAVENOUS | Status: AC | PRN
  Administered 2023-04-12: 6 mL via INTRAVENOUS

## 2023-04-12 MED ORDER — FLEET ENEMA RE ENEM
1.0000 | ENEMA | Freq: Once | RECTAL | Status: AC
  Administered 2023-04-12: 1 via RECTAL
  Filled 2023-04-12: qty 1

## 2023-04-12 MED ORDER — FLEET ENEMA RE ENEM: 1.0000 | ENEMA | Freq: Once | RECTAL | Status: DC

## 2023-04-12 NOTE — Progress Notes (Signed)
Inpatient Gyn Onc Progress Note  Subjective: Patient feels that she is overall stable. Reports having some stool output yesterday with milk of magnesia and enema. Thinks her abdomen may be slightly smaller. Pain stable, comes and goes. NGT came out overnight and was replaced. She thought she was emptying her bladder and wasn't sure why she had foley placed, but according to nursing this was due to retention. Ambulated in hallway yesterday. Up to chair now. Reports occasional ongoing flatus.  Objective: Vital signs in last 24 hours: Temp:  [97.8 F (36.6 C)-98.6 F (37 C)] 98.6 F (37 C) (02/02 0624) Pulse Rate:  [81-96] 81 (02/02 0624) Resp:  [15-19] 18 (02/02 0624) BP: (121-145)/(68-88) 121/68 (02/02 0624) SpO2:  [95 %-97 %] 97 % (02/02 0624) Last BM Date : 04/11/23  Intake/Output from previous day: 02/01 0701 - 02/02 0700 In: 133.1 [I.V.:133.1] Out: 1950 [Urine:1650; Emesis/NG output:300]  Physical Examination: General: Alert, oriented, no acute distress. HEENT: Normocephalic, atraumatic. Neck symmetric without masses. Sclera anicteric. NGT in place with small bilious output in cannister Chest: Normal work of breathing. Clear to auscultation bilaterally.   Cardiovascular: Regular rate and rhythm, no murmurs. Abdomen: Distended, rounded, tympanitic. Mild to moderate TTP.  Present bowel sounds. Extremities: Warm, well perfused.  No edema bilaterally. SCDs in place Skin: No rashes or lesions noted. GU: foley in place with yellow urine in bag  Labs:     CEA: 14.8 (elevated) CA125: 24.6 (normal)  Assessment: 68 y.o. a 13cm cystic pelvic mass that appears to be causing external compression on the rectum leading to a partial obstructive process and obstipation. Continues to have occasional flatus and reports some stool output with milk of magnesia and enema yesterday. NGT with bilious output. Overall clinically stable.   Reviewed that CA125 is normal and CEA is elevated. Given her  partial obstructive process and obstipation, preoperative evaluation with colonoscopy is likely unable to occur. Does report last colonoscopy in 2017 was normal with plan for 10 year follow-up.   Tentative plan to proceed with surgery on Tuesday. Will try to discuss with on-call general surgeon for Tuesday if they are able to be available if needed pending findings.   Recommendations: - Repeat bowel regimen today, ordered. Will follow with ongoing miralax - Recommend MRI pelvis to better characterize pelvic mass and for preoperative planning - Tentatively planning for OR on Tuesday.     LOS: 2 days    Kristy Catoe 04/12/2023, 9:19 AM

## 2023-04-12 NOTE — Progress Notes (Signed)
[facility] PROGRESS NOTE    Progress Note  Tonya Schneider  NGE:[id number] DOB: [birth year] DOA: 04/09/2023 PCP: Jarrett Soho, PA-C     Brief Narrative:   Tonya Schneider is an 68 y.o. female past medical history not significant from sent to the hospital for abdominal pain and colon obstruction, she related started about a month prior to admission with having crampy lower abdominal pain with intermittent diarrhea and constipation 3 days prior to admission she started having severe pain and ongoing watery diarrhea.  Denies any hematochezia in the ED, CT scan of the abdomen pelvis showed large fluid collection about 11 cm, vaginal ultrasound confirmed large cystic mass of the left adnexal region measuring about 11 cm   Assessment/Plan:   Abdominal pain and nausea likely due to 11 cm left cystic  Pelvic mass in female, Also causing distal obstruction of the colon: Patient was placed in p.o., NG tube was placed for intermittent suction. General surgery and GYN on board awaiting further recommendation. Continue enemas. CEA 14.8, which is elevated CA125 24 General surgery and GYN try and arrange for time this upcoming week.  Colonic obstruction mechanical due to left cystic mass: Currently with an NG tube. She relates her abdominal pain is slightly improved after NG tube placement. Started on enemas, had a bowel movement. Urinating without any difficulties.  Leukocytosis: Likely reactive improved after NG tube placement.   DVT prophylaxis: lovenox Family Communication:non Status is: Inpatient Remains inpatient appropriate because: Left cystic pelvic mass    Code Status:     Code Status Orders  (From admission, onward)           Start     Ordered   04/10/23 1338  Full code  Continuous       Question:  By:  Answer:  Consent: discussion documented in EHR   04/10/23 1338           Code Status History     This patient has a current code status  but no historical code status.      Advance Directive Documentation    Flowsheet Row Most Recent Value  Type of Advance Directive Healthcare Power of Attorney, Living will  Pre-existing out of facility DNR order (yellow form or pink MOST form) --  "MOST" Form in Place? --         IV Access:   Peripheral IV   Procedures and diagnostic studies:   DG Abd Portable 1V Result Date: 04/12/2023 CLINICAL DATA:  Nasogastric tube placement EXAM: PORTABLE ABDOMEN - 1 VIEW COMPARISON:  None Available. FINDINGS: Nasogastric tube tip overlies the proximal body of the stomach. Moderate stool noted within the visualized colon. Pelvis excluded from view. IMPRESSION: 1. Nasogastric tube tip within the proximal body of the stomach. Electronically Signed   By: Helyn Numbers M.D.   On: 04/12/2023 04:13     Medical Consultants:   None.   Subjective:    Tonya Schneider feels better abdomen less distended.  Objective:    Vitals:   04/11/23 1346 04/11/23 1849 04/11/23 2140 04/12/23 0624  BP: 138/88 (!) 145/77 131/84 121/68  Pulse: 91 89 96 81  Resp: 15 17 19 18   Temp: 98.1 F (36.7 C) 98.5 F (36.9 C) 97.8 F (36.6 C) 98.6 F (37 C)  TempSrc: Oral Oral Oral Oral  SpO2: 96% 97% 95% 97%  Weight:      Height:       SpO2: 97 %   Intake/Output Summary (Last  24 hours) at 04/12/2023 0938 Last data filed at 04/12/2023 0600 Gross per 24 hour  Intake 133.14 ml  Output 1950 ml  Net -1816.86 ml   Filed Weights   04/09/23 1636  Weight: 60.8 kg    Exam: General exam: In no acute distress. Respiratory system: Good air movement and clear to auscultation. Cardiovascular system: S1 & S2 heard, RRR. No JVD. Gastrointestinal system: Bowel sounds soft mildly distended nontender no rebound. Extremities: No pedal edema. Skin: No rashes, lesions or ulcers Psychiatry: Judgement and insight appear normal. Mood & affect appropriate.  Data Reviewed:    Labs: Basic Metabolic  Panel: Recent Labs  Lab 04/09/23 1642 04/11/23 0528  NA 134* 141  K 3.5 3.4*  CL 101 108  CO2 23 23  GLUCOSE 147* 88  BUN 20 21  CREATININE 0.73 0.68  CALCIUM 9.0 8.4*   GFR Estimated Creatinine Clearance: 63.7 mL/min (by C-G formula based on SCr of 0.68 mg/dL). Liver Function Tests: Recent Labs  Lab 04/09/23 1642  AST 11*  ALT 13  ALKPHOS 74  BILITOT 0.8  PROT 7.1  ALBUMIN 3.7   Recent Labs  Lab 04/09/23 1642  LIPASE <10*   No results for input(s): "AMMONIA" in the last 168 hours. Coagulation profile No results for input(s): "INR", "PROTIME" in the last 168 hours. COVID-19 Labs  No results for input(s): "DDIMER", "FERRITIN", "LDH", "CRP" in the last 72 hours.  No results found for: "SARSCOV2NAA"  CBC: Recent Labs  Lab 04/09/23 1642 04/11/23 0528  WBC 16.1* 9.8  HGB 12.6 11.1*  HCT 37.7 34.7*  MCV 85.7 91.3  PLT 284 252   Cardiac Enzymes: No results for input(s): "CKTOTAL", "CKMB", "CKMBINDEX", "TROPONINI" in the last 168 hours. BNP (last 3 results) No results for input(s): "PROBNP" in the last 8760 hours. CBG: No results for input(s): "GLUCAP" in the last 168 hours. D-Dimer: No results for input(s): "DDIMER" in the last 72 hours. Hgb A1c: No results for input(s): "HGBA1C" in the last 72 hours. Lipid Profile: No results for input(s): "CHOL", "HDL", "LDLCALC", "TRIG", "CHOLHDL", "LDLDIRECT" in the last 72 hours. Thyroid function studies: No results for input(s): "TSH", "T4TOTAL", "T3FREE", "THYROIDAB" in the last 72 hours.  Invalid input(s): "FREET3" Anemia work up: No results for input(s): "VITAMINB12", "FOLATE", "FERRITIN", "TIBC", "IRON", "RETICCTPCT" in the last 72 hours. Sepsis Labs: Recent Labs  Lab 04/09/23 1642 04/11/23 0528  WBC 16.1* 9.8   Microbiology No results found for this or any previous visit (from the past 240 hours).   Medications:    enoxaparin (LOVENOX) injection  40 mg Subcutaneous Q24H   ibuprofen  600 mg Oral  TID   Continuous Infusions:  dextrose 5 % and 0.45 % NaCl with KCl 20 mEq/L 75 mL/hr at 04/12/23 0138      LOS: 2 days   Marinda Elk  [facility]  04/12/2023, 9:38 AM

## 2023-04-12 NOTE — Progress Notes (Signed)
Mobility Specialist - Progress Note   04/12/23 0914  Mobility  Activity Transferred from bed to chair  Level of Assistance Modified independent, requires aide device or extra time  Assistive Device Other (Comment) (IV Pole)  Distance Ambulated (ft) 2 ft  Activity Response Tolerated well  Mobility Referral Yes  Mobility visit 1 Mobility  Mobility Specialist Start Time (ACUTE ONLY) 0901  Mobility Specialist Stop Time (ACUTE ONLY) 0913  Mobility Specialist Time Calculation (min) (ACUTE ONLY) 12 min   Pt received in bed declining mobility but agreeable to transfer to the recliner. C/o pain near incisions. No other complaints during session. Pt voiced wanting to ambulate later today. Pt to recliner after session with all needs met.   [facility]

## 2023-04-12 NOTE — Progress Notes (Signed)
Pt removed NG tube NG tube replaced to left nare, checked for placement connected to LIS

## 2023-04-13 DIAGNOSIS — R1900 Intra-abdominal and pelvic swelling, mass and lump, unspecified site: Secondary | ICD-10-CM | POA: Diagnosis not present

## 2023-04-13 DIAGNOSIS — K56600 Partial intestinal obstruction, unspecified as to cause: Secondary | ICD-10-CM | POA: Diagnosis not present

## 2023-04-13 DIAGNOSIS — R978 Other abnormal tumor markers: Secondary | ICD-10-CM | POA: Diagnosis not present

## 2023-04-13 LAB — BASIC METABOLIC PANEL
Anion gap: 12 (ref 5–15)
BUN: 16 mg/dL (ref 8–23)
CO2: 24 mmol/L (ref 22–32)
Calcium: 8.4 mg/dL — ABNORMAL LOW (ref 8.9–10.3)
Chloride: 107 mmol/L (ref 98–111)
Creatinine, Ser: 0.49 mg/dL (ref 0.44–1.00)
GFR, Estimated: >60 mL/min (ref >60)
Glucose, Bld: 97 mg/dL (ref 70–99)
Potassium: 3.1 mmol/L — ABNORMAL LOW (ref 3.5–5.1)
Sodium: 143 mmol/L (ref 135–145)

## 2023-04-13 LAB — SURGICAL PCR SCREEN
MRSA, PCR: NEGATIVE
Staphylococcus aureus: POSITIVE — AB

## 2023-04-13 MED ORDER — FLEET ENEMA RE ENEM
1.0000 | ENEMA | Freq: Once | RECTAL | Status: AC
  Administered 2023-04-13: 1 via RECTAL
  Filled 2023-04-13: qty 1

## 2023-04-13 MED ORDER — POTASSIUM CHLORIDE IN NACL 40-0.9 MEQ/L-% IV SOLN
INTRAVENOUS | Status: DC
  Filled 2023-04-13: qty 1000

## 2023-04-13 MED ORDER — CHLORHEXIDINE GLUCONATE CLOTH 2 % EX PADS
6.0000 | MEDICATED_PAD | Freq: Every day | CUTANEOUS | Status: DC
  Administered 2023-04-14 – 2023-04-15 (×2): 6 via TOPICAL

## 2023-04-13 MED ORDER — POTASSIUM CHLORIDE IN NACL 20-0.9 MEQ/L-% IV SOLN
INTRAVENOUS | Status: DC
  Filled 2023-04-13: qty 1000

## 2023-04-13 MED ORDER — HEPARIN SODIUM (PORCINE) 5000 UNIT/ML IJ SOLN
5000.0000 [IU] | Freq: Once | INTRAMUSCULAR | Status: AC
  Administered 2023-04-13: 5000 [IU] via SUBCUTANEOUS
  Filled 2023-04-13: qty 1

## 2023-04-13 MED ORDER — POTASSIUM CHLORIDE 10 MEQ/100ML IV SOLN
10.0000 meq | INTRAVENOUS | Status: AC
  Administered 2023-04-13 (×4): 10 meq via INTRAVENOUS
  Filled 2023-04-13: qty 100

## 2023-04-13 MED ORDER — HEPARIN SODIUM (PORCINE) 5000 UNIT/ML IJ SOLN
5000.0000 [IU] | INTRAMUSCULAR | Status: AC
Start: 2023-04-14 — End: 2023-04-15
  Administered 2023-04-14: 5000 [IU] via SUBCUTANEOUS
  Filled 2023-04-13: qty 1

## 2023-04-13 MED ORDER — METRONIDAZOLE 500 MG/100ML IV SOLN
500.0000 mg | INTRAVENOUS | Status: AC
Start: 2023-04-14 — End: 2023-04-15
  Administered 2023-04-14: 500 mg via INTRAVENOUS
  Filled 2023-04-13: qty 100

## 2023-04-13 MED ORDER — MUPIROCIN 2 % EX OINT
1.0000 | TOPICAL_OINTMENT | Freq: Two times a day (BID) | CUTANEOUS | Status: AC
  Administered 2023-04-14 – 2023-04-18 (×10): 1 via NASAL
  Filled 2023-04-13 (×2): qty 22

## 2023-04-13 MED ORDER — CEFAZOLIN SODIUM-DEXTROSE 2-4 GM/100ML-% IV SOLN
2.0000 g | INTRAVENOUS | Status: AC
Start: 2023-04-14 — End: 2023-04-15
  Administered 2023-04-14: 2 g via INTRAVENOUS
  Filled 2023-04-13: qty 100

## 2023-04-13 NOTE — Progress Notes (Signed)
Inpatient Gyn Onc Progress Note  Subjective: Patient feels a little better after talking to Dr. Gerrit Schneider. Had small BM this am. Enema was "quite productive" day before. No nausea this am and this has improved. Occasional occurrences of abdominal discomfort. Abdominal girth has not changed in size per pt. Walking to bathroom without issue.  Objective: Vital signs in last 24 hours: Temp:  [97.4 F (36.3 C)-98.7 F (37.1 C)] 98.3 F (36.8 C) (02/03 1418) Pulse Rate:  [91-102] 91 (02/03 1418) Resp:  [15-20] 20 (02/03 1418) BP: (138-152)/(87-92) 138/90 (02/03 1418) SpO2:  [96 %-98 %] 98 % (02/03 1418) Last BM Date : 04/12/23  Intake/Output from previous day: 02/02 0701 - 02/03 0700 In: 90 [P.O.:90] Out: 800 [Urine:650; Emesis/NG output:150]  Physical Examination: General: Alert, oriented, no acute distress. HEENT: Normocephalic, atraumatic. Neck symmetric without masses. Sclera anicteric. NGT in place with around 200 cc bilious output in cannister Chest: Normal work of breathing. Clear to auscultation bilaterally.   Cardiovascular: Regular rate and rhythm, no murmurs. Abdomen: Distended, rounded, tympanitic. Mild to moderate TTP.  Decreased bowel sounds. Extremities: Warm, well perfused.  No edema bilaterally. SCDs in place Skin: No rashes or lesions noted. GU: foley in place with yellow urine in bag  Labs:   BUN/Cr/glu/ALT/AST/amyl/lip:  16/0.49/--/--/--/--/-- (02/03 0904) CEA: 14.8 (elevated) CA125: 24.6 (normal)  Assessment: 68 y.o. a 13cm cystic pelvic mass that appears to be causing external compression on the rectum leading to a partial obstructive process and obstipation. Continues to have occasional flatus and reports some stool output with current regimen. NGT with bilious output. Overall clinically stable.   CA125 is normal and CEA is elevated. MRI reviewed with the patient by Dr. Alvester Schneider. Given her partial obstructive process and obstipation, preoperative evaluation with  colonoscopy is likely unable to occur. Does report last colonoscopy in 2017 was normal with plan for 10 year follow-up.   Continue with plan to proceed with surgery on Tuesday with Dr. Alvester Schneider.   Recommendations: Continue with plan for surgery tomorrow with Dr. Alvester Schneider Am labs ordered   LOS: 3 days    Tonya Schneider 04/13/2023, 2:51 PM

## 2023-04-13 NOTE — Consult Note (Signed)
WOC Nurse requested for preoperative stoma site marking  Discussed surgical procedure and stoma creation with patient and family.  Explained role of the WOC nurse team.  Provided the patient with educational booklet and provided samples of pouching options.  Answered patient and family questions.   Examined patient lying, sitting, and standing in order to place the marking in the patient's visual field, away from any creases or abdominal contour issues and within the rectus muscle.  Attempted to mark below the patient's belt line.   Marked for colostomy in the LLQ  __4__ cm to the left of the umbilicus and _4.5___cm below the umbilicus.  Marked for ileostomy in the RLQ  __5__cm to the right of the umbilicus and  __4.5__ cm above/below the umbilicus.    Patient's abdomen cleansed with CHG wipes at site markings, allowed to air dry prior to marking.Covered mark with thin film transparent dressing to preserve mark until date of surgery.   WOC Nurse team will follow up with patient after surgery for continue ostomy care and teaching.  Charron Coultas [facility] MSN, RN,CWOCN, CNS, [employer] [phone]

## 2023-04-13 NOTE — Progress Notes (Signed)
[facility] PROGRESS NOTE    Progress Note  Tonya Schneider  YNW:[id number] DOB: [birth year] DOA: 04/09/2023 PCP: Jarrett Soho, PA-C     Brief Narrative:   Tonya Schneider is an 68 y.o. female past medical history not significant from sent to the hospital for abdominal pain and colon obstruction, she related started about a month prior to admission with having crampy lower abdominal pain with intermittent diarrhea and constipation 3 days prior to admission she started having severe pain and ongoing watery diarrhea.  Denies any hematochezia in the ED, CT scan of the abdomen pelvis showed large fluid collection about 11 cm, vaginal ultrasound confirmed large cystic mass of the left adnexal region measuring about 11 cm. Assessment/Plan:   Abdominal pain and nausea likely due to 11 cm left cystic  Pelvic mass in female, Also causing distal obstruction of the colon: Patient was placed in N.P.O.., NG tube was placed for intermittent suction. General surgery and GYN on board awaiting further recommendation. Continue enemas. CEA 14.8, which is elevated CA125 24 General Surgery GYN I try to coordinate time for surgical intervention  Colonic obstruction mechanical due to left cystic mass: Currently with an NG tube. She relates her abdominal pain is improved after NG tube placement. Started on enemas, had a bowel movement. Urinating without any difficulties.  Leukocytosis: Likely reactive improved after NG tube placement.   DVT prophylaxis: lovenox Family Communication:non Status is: Inpatient Remains inpatient appropriate because: Left cystic pelvic mass    Code Status:     Code Status Orders  (From admission, onward)           Start     Ordered   04/10/23 1338  Full code  Continuous       Question:  By:  Answer:  Consent: discussion documented in EHR   04/10/23 1338           Code Status History     This patient has a current code status but no  historical code status.      Advance Directive Documentation    Flowsheet Row Most Recent Value  Type of Advance Directive Healthcare Power of Attorney, Living will  Pre-existing out of facility DNR order (yellow form or pink MOST form) --  "MOST" Form in Place? --         IV Access:   Peripheral IV   Procedures and diagnostic studies:   MR PELVIS W WO CONTRAST Result Date: 04/12/2023 CLINICAL DATA:  Cystic lesion within the pelvis on CT and ultrasound. EXAM: MRI PELVIS WITHOUT AND WITH CONTRAST TECHNIQUE: Multiplanar multisequence MR imaging of the pelvis was performed both before and after administration of intravenous contrast. CONTRAST:  6mL GADAVIST GADOBUTROL 1 MMOL/ML IV SOLN COMPARISON:  04/09/2023 CT and ultrasound FINDINGS: Exam is moderate to markedly limited secondary to susceptibility artifact from right hip arthroplasty as well as the extent of colonic gas and stool, causing further susceptibility artifact. Urinary Tract: Urinary bladder is collapsed around a Foley catheter. Lower pole right renal 1.5 cm cyst . In the absence of clinically indicated signs/symptoms require(s) no independent follow-up. Bowel: The low rectum and anus are compressed by the central pelvic process. The upstream colon is dilated and stool-filled. Vascular/Lymphatic: Limited evaluation for pelvic aneurysm or sidewall adenopathy. None present on recent CT. Reproductive: The uterus is likely visualized eccentric left and 15/13. Ovaries not visualized. A complex deep central pelvic floor collection measures 6.8 x 6.8 by 9.8 cm. Example 21/3 and 18/6. Position and  shape favor dilated, fluid-filled vagina. No well-defined obstructive mass. Post-contrast images demonstrate no significant enhancement within this collection. Example 45/17. Other:  None. Musculoskeletal: Right hip arthroplasty. IMPRESSION: 1. Moderate to markedly degraded exam secondary to right hip arthroplasty and the extent of gas and stool.  2. Complex collection within the deep central pelvis, favored to represent hydrocolpos. Consider physical exam correlation. 3. Compressed low rectum and anus by the pelvic collection. Upstream dilated colon suggests a component of obstruction. Electronically Signed   By: Jeronimo Greaves M.D.   On: 04/12/2023 16:05   DG Abd Portable 1V Result Date: 04/12/2023 CLINICAL DATA:  Nasogastric tube placement EXAM: PORTABLE ABDOMEN - 1 VIEW COMPARISON:  None Available. FINDINGS: Nasogastric tube tip overlies the proximal body of the stomach. Moderate stool noted within the visualized colon. Pelvis excluded from view. IMPRESSION: 1. Nasogastric tube tip within the proximal body of the stomach. Electronically Signed   By: Helyn Numbers M.D.   On: 04/12/2023 04:13     Medical Consultants:   None.   Subjective:    Tonya Schneider is better less distended.  Objective:    Vitals:   04/12/23 0624 04/12/23 1444 04/12/23 2050 04/13/23 0532  BP: 121/68 (!) 140/89 (!) 142/92 (!) 152/87  Pulse: 81 84 (!) 102 94  Resp: 18 18 16 15   Temp: 98.6 F (37 C) 97.7 F (36.5 C) (!) 97.4 F (36.3 C) 98.7 F (37.1 C)  TempSrc: Oral Oral Oral Oral  SpO2: 97% 98% 98% 96%  Weight:      Height:       SpO2: 96 %   Intake/Output Summary (Last 24 hours) at 04/13/2023 0933 Last data filed at 04/13/2023 0804 Gross per 24 hour  Intake 90 ml  Output 800 ml  Net -710 ml   Filed Weights   04/09/23 1636  Weight: 60.8 kg    Exam: General exam: In no acute distress. Respiratory system: Good air movement and clear to auscultation. Cardiovascular system: S1 & S2 heard, RRR. No JVD. Gastrointestinal system: Abdomen is nondistended, soft and nontender.  Extremities: No pedal edema. Skin: No rashes, lesions or ulcers Psychiatry: Judgement and insight appear normal. Mood & affect appropriate.  Data Reviewed:    Labs: Basic Metabolic Panel: Recent Labs  Lab 04/09/23 1642 04/11/23 0528  NA 134* 141  K 3.5  3.4*  CL 101 108  CO2 23 23  GLUCOSE 147* 88  BUN 20 21  CREATININE 0.73 0.68  CALCIUM 9.0 8.4*   GFR Estimated Creatinine Clearance: 63.7 mL/min (by C-G formula based on SCr of 0.68 mg/dL). Liver Function Tests: Recent Labs  Lab 04/09/23 1642  AST 11*  ALT 13  ALKPHOS 74  BILITOT 0.8  PROT 7.1  ALBUMIN 3.7   Recent Labs  Lab 04/09/23 1642  LIPASE <10*   No results for input(s): "AMMONIA" in the last 168 hours. Coagulation profile No results for input(s): "INR", "PROTIME" in the last 168 hours. COVID-19 Labs  No results for input(s): "DDIMER", "FERRITIN", "LDH", "CRP" in the last 72 hours.  No results found for: "SARSCOV2NAA"  CBC: Recent Labs  Lab 04/09/23 1642 04/11/23 0528  WBC 16.1* 9.8  HGB 12.6 11.1*  HCT 37.7 34.7*  MCV 85.7 91.3  PLT 284 252   Cardiac Enzymes: No results for input(s): "CKTOTAL", "CKMB", "CKMBINDEX", "TROPONINI" in the last 168 hours. BNP (last 3 results) No results for input(s): "PROBNP" in the last 8760 hours. CBG: No results for input(s): "GLUCAP" in the last  168 hours. D-Dimer: No results for input(s): "DDIMER" in the last 72 hours. Hgb A1c: No results for input(s): "HGBA1C" in the last 72 hours. Lipid Profile: No results for input(s): "CHOL", "HDL", "LDLCALC", "TRIG", "CHOLHDL", "LDLDIRECT" in the last 72 hours. Thyroid function studies: No results for input(s): "TSH", "T4TOTAL", "T3FREE", "THYROIDAB" in the last 72 hours.  Invalid input(s): "FREET3" Anemia work up: No results for input(s): "VITAMINB12", "FOLATE", "FERRITIN", "TIBC", "IRON", "RETICCTPCT" in the last 72 hours. Sepsis Labs: Recent Labs  Lab 04/09/23 1642 04/11/23 0528  WBC 16.1* 9.8   Microbiology No results found for this or any previous visit (from the past 240 hours).   Medications:    Chlorhexidine Gluconate Cloth  6 each Topical Daily   enoxaparin (LOVENOX) injection  40 mg Subcutaneous Q24H   ibuprofen  600 mg Oral TID   polyethylene  glycol  17 g Per NG tube BID   Continuous Infusions:  0.9 % NaCl with KCl 20 mEq / L 75 mL/hr at 04/13/23 0854      LOS: 3 days   Marinda Elk  [facility]  04/13/2023, 9:33 AM

## 2023-04-13 NOTE — Progress Notes (Signed)
   04/13/23 0923  TOC Brief Assessment  Insurance and Status Reviewed  Patient has primary care physician Yes  Home environment has been reviewed Resides in single family home with spouse  Prior level of function: Independent with ADLs at baseline  Prior/Current Home Services No current home services  Social Drivers of Health Review SDOH reviewed no interventions necessary  Readmission risk has been reviewed Yes  Transition of care needs no transition of care needs at this time

## 2023-04-13 NOTE — Progress Notes (Signed)
Assessment & Plan: Pelvic mass with distal colonic obstruction  OR planning in progress per Dr. Alvester Morin  General surgery will be available at time of surgery  Discussed possible colonic/rectal resection with possible colostomy        Darnell Level, MD [facility] practice Office: [phone]        Chief Complaint: Pelvic mass with distal colonic obstruction  Subjective: Patient in bed, ostomy nurse at bedside for pre-op marking.  Husband in room too.  Objective: Vital signs in last 24 hours: Temp:  [97.4 F (36.3 C)-98.7 F (37.1 C)] 98.3 F (36.8 C) (02/03 1418) Pulse Rate:  [84-102] 91 (02/03 1418) Resp:  [15-20] 20 (02/03 1418) BP: (138-152)/(87-92) 138/90 (02/03 1418) SpO2:  [96 %-98 %] 98 % (02/03 1418) Last BM Date : 04/12/23  Intake/Output from previous day: 02/02 0701 - 02/03 0700 In: 90 [P.O.:90] Out: 800 [Urine:650; Emesis/NG output:150] Intake/Output this shift: Total I/O In: 61 [P.O.:10; I.V.:46.9; IV Piggyback:4.1] Out: 875 [Urine:475; Emesis/NG output:400]  Physical Exam: Deferred - per WOC nurse  Lab Results:  Recent Labs    04/11/23 0528  WBC 9.8  HGB 11.1*  HCT 34.7*  PLT 252   BMET Recent Labs    04/11/23 0528 04/13/23 0904  NA 141 143  K 3.4* 3.1*  CL 108 107  CO2 23 24  GLUCOSE 88 97  BUN 21 16  CREATININE 0.68 0.49  CALCIUM 8.4* 8.4*   PT/INR No results for input(s): "LABPROT", "INR" in the last 72 hours. Comprehensive Metabolic Panel:    Component Value Date/Time   NA 143 04/13/2023 0904   NA 141 04/11/2023 0528   K 3.1 (L) 04/13/2023 0904   K 3.4 (L) 04/11/2023 0528   CL 107 04/13/2023 0904   CL 108 04/11/2023 0528   CO2 24 04/13/2023 0904   CO2 23 04/11/2023 0528   BUN 16 04/13/2023 0904   BUN 21 04/11/2023 0528   CREATININE 0.49 04/13/2023 0904   CREATININE 0.68 04/11/2023 0528   GLUCOSE 97 04/13/2023 0904   GLUCOSE 88 04/11/2023 0528   CALCIUM 8.4 (L) 04/13/2023 0904   CALCIUM  8.4 (L) 04/11/2023 0528   AST 11 (L) 04/09/2023 1642   ALT 13 04/09/2023 1642   ALKPHOS 74 04/09/2023 1642   BILITOT 0.8 04/09/2023 1642   PROT 7.1 04/09/2023 1642   ALBUMIN 3.7 04/09/2023 1642    Studies/Results: MR PELVIS W WO CONTRAST Result Date: 04/12/2023 CLINICAL DATA:  Cystic lesion within the pelvis on CT and ultrasound. EXAM: MRI PELVIS WITHOUT AND WITH CONTRAST TECHNIQUE: Multiplanar multisequence MR imaging of the pelvis was performed both before and after administration of intravenous contrast. CONTRAST:  6mL GADAVIST GADOBUTROL 1 MMOL/ML IV SOLN COMPARISON:  04/09/2023 CT and ultrasound FINDINGS: Exam is moderate to markedly limited secondary to susceptibility artifact from right hip arthroplasty as well as the extent of colonic gas and stool, causing further susceptibility artifact. Urinary Tract: Urinary bladder is collapsed around a Foley catheter. Lower pole right renal 1.5 cm cyst . In the absence of clinically indicated signs/symptoms require(s) no independent follow-up. Bowel: The low rectum and anus are compressed by the central pelvic process. The upstream colon is dilated and stool-filled. Vascular/Lymphatic: Limited evaluation for pelvic aneurysm or sidewall adenopathy. None present on recent CT. Reproductive: The uterus is likely visualized eccentric left and 15/13. Ovaries not visualized. A complex deep central pelvic floor collection measures 6.8 x 6.8 by 9.8 cm. Example 21/3 and 18/6. Position  and shape favor dilated, fluid-filled vagina. No well-defined obstructive mass. Post-contrast images demonstrate no significant enhancement within this collection. Example 45/17. Other:  None. Musculoskeletal: Right hip arthroplasty. IMPRESSION: 1. Moderate to markedly degraded exam secondary to right hip arthroplasty and the extent of gas and stool. 2. Complex collection within the deep central pelvis, favored to represent hydrocolpos. Consider physical exam correlation. 3. Compressed  low rectum and anus by the pelvic collection. Upstream dilated colon suggests a component of obstruction. Electronically Signed   By: Jeronimo Greaves M.D.   On: 04/12/2023 16:05   DG Abd Portable 1V Result Date: 04/12/2023 CLINICAL DATA:  Nasogastric tube placement EXAM: PORTABLE ABDOMEN - 1 VIEW COMPARISON:  None Available. FINDINGS: Nasogastric tube tip overlies the proximal body of the stomach. Moderate stool noted within the visualized colon. Pelvis excluded from view. IMPRESSION: 1. Nasogastric tube tip within the proximal body of the stomach. Electronically Signed   By: Helyn Numbers M.D.   On: 04/12/2023 04:13      Darnell Level 04/13/2023  Patient ID: Tonya Schneider, female   DOB: [birth year], 68 y.o.   MRN: [id number]

## 2023-04-13 NOTE — Plan of Care (Signed)
   Problem: Education: Goal: Knowledge of General Education information will improve Description: Including pain rating scale, medication(s)/side effects and non-pharmacologic comfort measures Outcome: Progressing   Problem: Coping: Goal: Level of anxiety will decrease Outcome: Progressing

## 2023-04-14 ENCOUNTER — Encounter (HOSPITAL_COMMUNITY)
Admission: EM | Disposition: A | Discharge status: Home / Self Care | Payer: Self-pay | Source: Home / Self Care | Attending: Internal Medicine

## 2023-04-14 ENCOUNTER — Other Ambulatory Visit: Payer: Self-pay

## 2023-04-14 ENCOUNTER — Inpatient Hospital Stay (HOSPITAL_COMMUNITY): Payer: Medicare HMO | Admitting: Anesthesiology

## 2023-04-14 ENCOUNTER — Encounter (HOSPITAL_COMMUNITY): Payer: Self-pay | Admitting: Internal Medicine

## 2023-04-14 DIAGNOSIS — N739 Female pelvic inflammatory disease, unspecified: Secondary | ICD-10-CM | POA: Diagnosis not present

## 2023-04-14 DIAGNOSIS — R1900 Intra-abdominal and pelvic swelling, mass and lump, unspecified site: Secondary | ICD-10-CM | POA: Diagnosis not present

## 2023-04-14 DIAGNOSIS — K56609 Unspecified intestinal obstruction, unspecified as to partial versus complete obstruction: Secondary | ICD-10-CM | POA: Diagnosis not present

## 2023-04-14 HISTORY — PX: ABDOMINAL HYSTERECTOMY: SHX81

## 2023-04-14 HISTORY — PX: INCISION AND DRAINAGE ABSCESS: SHX5864

## 2023-04-14 LAB — BASIC METABOLIC PANEL
Anion gap: 15 (ref 5–15)
BUN: 13 mg/dL (ref 8–23)
CO2: 22 mmol/L (ref 22–32)
Calcium: 8.3 mg/dL — ABNORMAL LOW (ref 8.9–10.3)
Chloride: 106 mmol/L (ref 98–111)
Creatinine, Ser: 0.6 mg/dL (ref 0.44–1.00)
GFR, Estimated: >60 mL/min (ref >60)
Glucose, Bld: 98 mg/dL (ref 70–99)
Potassium: 3.3 mmol/L — ABNORMAL LOW (ref 3.5–5.1)
Sodium: 143 mmol/L (ref 135–145)

## 2023-04-14 LAB — CBC
HCT: 39.5 % (ref 36.0–46.0)
Hemoglobin: 12.4 g/dL (ref 12.0–15.0)
MCH: 28.1 pg (ref 26.0–34.0)
MCHC: 31.4 g/dL (ref 30.0–36.0)
MCV: 89.4 fL (ref 80.0–100.0)
Platelets: 353 10*3/uL (ref 150–400)
RBC: 4.42 MIL/uL (ref 3.87–5.11)
RDW: 13.4 % (ref 11.5–15.5)
WBC: 9.4 10*3/uL (ref 4.0–10.5)
nRBC: 0 % (ref 0.0–0.2)

## 2023-04-14 LAB — TYPE AND SCREEN
ABO/RH(D): A POS
Antibody Screen: NEGATIVE

## 2023-04-14 LAB — ABO/RH: ABO/RH(D): A POS

## 2023-04-14 SURGERY — HYSTERECTOMY, ABDOMINAL
Anesthesia: General

## 2023-04-14 MED ORDER — SODIUM CHLORIDE (PF) 0.9 % IJ SOLN
INTRAMUSCULAR | Status: DC | PRN
  Administered 2023-04-14: 20 mL

## 2023-04-14 MED ORDER — 0.9 % SODIUM CHLORIDE (POUR BTL) OPTIME
TOPICAL | Status: DC | PRN
  Administered 2023-04-14: 3000 mL

## 2023-04-14 MED ORDER — SUGAMMADEX SODIUM 200 MG/2ML IV SOLN
INTRAVENOUS | Status: DC | PRN
  Administered 2023-04-14: 200 mg via INTRAVENOUS

## 2023-04-14 MED ORDER — SUCCINYLCHOLINE CHLORIDE 200 MG/10ML IV SOSY
PREFILLED_SYRINGE | INTRAVENOUS | Status: DC | PRN
  Administered 2023-04-14: 80 mg via INTRAVENOUS

## 2023-04-14 MED ORDER — DEXAMETHASONE SODIUM PHOSPHATE 10 MG/ML IJ SOLN
INTRAMUSCULAR | Status: AC
  Filled 2023-04-14: qty 1

## 2023-04-14 MED ORDER — HYDROMORPHONE HCL 1 MG/ML IJ SOLN
0.2500 mg | INTRAMUSCULAR | Status: DC | PRN
  Administered 2023-04-14 (×2): 0.5 mg via INTRAVENOUS

## 2023-04-14 MED ORDER — DROPERIDOL 2.5 MG/ML IJ SOLN: 0.6250 mg | Freq: Once | INTRAMUSCULAR | Status: DC | PRN

## 2023-04-14 MED ORDER — METOPROLOL TARTRATE 5 MG/5ML IV SOLN
INTRAVENOUS | Status: AC
  Filled 2023-04-14: qty 5

## 2023-04-14 MED ORDER — BUPIVACAINE LIPOSOME 1.3 % IJ SUSP
INTRAMUSCULAR | Status: AC
  Filled 2023-04-14: qty 20

## 2023-04-14 MED ORDER — DEXMEDETOMIDINE HCL IN NACL 80 MCG/20ML IV SOLN
INTRAVENOUS | Status: DC | PRN
  Administered 2023-04-14 (×2): 8 ug via INTRAVENOUS

## 2023-04-14 MED ORDER — SUCCINYLCHOLINE CHLORIDE 200 MG/10ML IV SOSY
PREFILLED_SYRINGE | INTRAVENOUS | Status: AC
Start: 2023-04-14 — End: ?
  Filled 2023-04-14: qty 10

## 2023-04-14 MED ORDER — PIPERACILLIN-TAZOBACTAM 3.375 G IVPB
INTRAVENOUS | Status: AC
  Filled 2023-04-14: qty 50

## 2023-04-14 MED ORDER — LIDOCAINE HCL (CARDIAC) PF 100 MG/5ML IV SOSY
PREFILLED_SYRINGE | INTRAVENOUS | Status: DC | PRN
  Administered 2023-04-14: 70 mg via INTRAVENOUS

## 2023-04-14 MED ORDER — HYDROMORPHONE HCL 1 MG/ML IJ SOLN
INTRAMUSCULAR | Status: DC | PRN
  Administered 2023-04-14 (×2): 1 mg via INTRAVENOUS

## 2023-04-14 MED ORDER — ALBUMIN HUMAN 5 % IV SOLN
INTRAVENOUS | Status: AC
Start: 2023-04-14 — End: ?
  Filled 2023-04-14: qty 250

## 2023-04-14 MED ORDER — ONDANSETRON HCL 4 MG/2ML IJ SOLN
INTRAMUSCULAR | Status: AC
  Filled 2023-04-14: qty 2

## 2023-04-14 MED ORDER — LACTATED RINGERS IV SOLN: INTRAVENOUS | Status: DC | PRN

## 2023-04-14 MED ORDER — ALBUMIN HUMAN 5 % IV SOLN: INTRAVENOUS | Status: DC | PRN

## 2023-04-14 MED ORDER — SUCCINYLCHOLINE CHLORIDE 200 MG/10ML IV SOSY
PREFILLED_SYRINGE | INTRAVENOUS | Status: AC
  Filled 2023-04-14: qty 10

## 2023-04-14 MED ORDER — ROCURONIUM BROMIDE 10 MG/ML (PF) SYRINGE
PREFILLED_SYRINGE | INTRAVENOUS | Status: AC
  Filled 2023-04-14: qty 10

## 2023-04-14 MED ORDER — DEXAMETHASONE SODIUM PHOSPHATE 4 MG/ML IJ SOLN
INTRAMUSCULAR | Status: DC | PRN
  Administered 2023-04-14: 8 mg via INTRAVENOUS

## 2023-04-14 MED ORDER — SODIUM CHLORIDE (PF) 0.9 % IJ SOLN
INTRAMUSCULAR | Status: AC
  Filled 2023-04-14: qty 20

## 2023-04-14 MED ORDER — FENTANYL CITRATE (PF) 100 MCG/2ML IJ SOLN
INTRAMUSCULAR | Status: AC
  Filled 2023-04-14: qty 2

## 2023-04-14 MED ORDER — KETAMINE HCL 50 MG/5ML IJ SOSY
PREFILLED_SYRINGE | INTRAMUSCULAR | Status: AC
  Filled 2023-04-14: qty 5

## 2023-04-14 MED ORDER — PIPERACILLIN-TAZOBACTAM 3.375 G IVPB
3.3750 g | Freq: Three times a day (TID) | INTRAVENOUS | Status: DC
  Administered 2023-04-14 – 2023-04-16 (×5): 3.375 g via INTRAVENOUS
  Filled 2023-04-14 (×4): qty 50

## 2023-04-14 MED ORDER — ONDANSETRON HCL 4 MG/2ML IJ SOLN
INTRAMUSCULAR | Status: DC | PRN
  Administered 2023-04-14: 4 mg via INTRAVENOUS

## 2023-04-14 MED ORDER — BUPIVACAINE LIPOSOME 1.3 % IJ SUSP
INTRAMUSCULAR | Status: DC | PRN
  Administered 2023-04-14: 20 mL

## 2023-04-14 MED ORDER — PHENYLEPHRINE HCL-NACL 20-0.9 MG/250ML-% IV SOLN
INTRAVENOUS | Status: DC | PRN
  Administered 2023-04-14: 15 ug/min via INTRAVENOUS

## 2023-04-14 MED ORDER — HYDROMORPHONE HCL 2 MG/ML IJ SOLN
INTRAMUSCULAR | Status: AC
  Filled 2023-04-14: qty 1

## 2023-04-14 MED ORDER — METOPROLOL TARTRATE 5 MG/5ML IV SOLN
INTRAVENOUS | Status: DC | PRN
  Administered 2023-04-14: 1 mg via INTRAVENOUS

## 2023-04-14 MED ORDER — ENOXAPARIN SODIUM 40 MG/0.4ML IJ SOSY
40.0000 mg | PREFILLED_SYRINGE | INTRAMUSCULAR | Status: DC
  Administered 2023-04-15 – 2023-04-21 (×7): 40 mg via SUBCUTANEOUS
  Filled 2023-04-14 (×7): qty 0.4

## 2023-04-14 MED ORDER — PHENYLEPHRINE HCL (PRESSORS) 10 MG/ML IV SOLN
INTRAVENOUS | Status: DC | PRN
  Administered 2023-04-14: 160 ug via INTRAVENOUS
  Administered 2023-04-14: 40 ug via INTRAVENOUS
  Administered 2023-04-14 (×2): 80 ug via INTRAVENOUS

## 2023-04-14 MED ORDER — KETAMINE HCL 10 MG/ML IJ SOLN
INTRAMUSCULAR | Status: DC | PRN
  Administered 2023-04-14: 50 mg via INTRAVENOUS

## 2023-04-14 MED ORDER — PIPERACILLIN-TAZOBACTAM 3.375 G IVPB
3.3750 g | Freq: Once | INTRAVENOUS | Status: DC
  Filled 2023-04-14: qty 50

## 2023-04-14 MED ORDER — MIDAZOLAM HCL 2 MG/2ML IJ SOLN
INTRAMUSCULAR | Status: AC
Start: 2023-04-14 — End: ?
  Filled 2023-04-14: qty 2

## 2023-04-14 MED ORDER — POTASSIUM CHLORIDE 10 MEQ/100ML IV SOLN
10.0000 meq | INTRAVENOUS | Status: DC
  Administered 2023-04-14 (×3): 10 meq via INTRAVENOUS
  Filled 2023-04-14 (×5): qty 100

## 2023-04-14 MED ORDER — SODIUM CHLORIDE 0.9 % IV SOLN: INTRAVENOUS | Status: DC | PRN

## 2023-04-14 MED ORDER — PROPOFOL 10 MG/ML IV BOLUS
INTRAVENOUS | Status: DC | PRN
  Administered 2023-04-14: 100 mg via INTRAVENOUS
  Administered 2023-04-14: 50 mg via INTRAVENOUS

## 2023-04-14 MED ORDER — DEXMEDETOMIDINE HCL IN NACL 80 MCG/20ML IV SOLN
INTRAVENOUS | Status: AC
Start: 2023-04-14 — End: ?
  Filled 2023-04-14: qty 20

## 2023-04-14 MED ORDER — HYDROMORPHONE HCL 1 MG/ML IJ SOLN
INTRAMUSCULAR | Status: AC
  Filled 2023-04-14: qty 1

## 2023-04-14 MED ORDER — ALBUMIN HUMAN 5 % IV SOLN
INTRAVENOUS | Status: AC
  Filled 2023-04-14: qty 250

## 2023-04-14 MED ORDER — PHENYLEPHRINE HCL (PRESSORS) 10 MG/ML IV SOLN: INTRAVENOUS | Status: DC | PRN

## 2023-04-14 MED ORDER — ROCURONIUM BROMIDE 100 MG/10ML IV SOLN
INTRAVENOUS | Status: DC | PRN
  Administered 2023-04-14: 10 mg via INTRAVENOUS
  Administered 2023-04-14: 40 mg via INTRAVENOUS
  Administered 2023-04-14: 60 mg via INTRAVENOUS

## 2023-04-14 SURGICAL SUPPLY — 68 items
ATTRACTOMAT 16X20 MAGNETIC DRP (DRAPES) ×1 IMPLANT
BAG COUNTER SPONGE SURGICOUNT (BAG) IMPLANT
BLADE EXTENDED COATED 6.5IN (ELECTRODE) ×3 IMPLANT
CHLORAPREP W/TINT 26 (MISCELLANEOUS) ×3 IMPLANT
CLIP TI LARGE 6 (CLIP) ×2 IMPLANT
CLIP TI MEDIUM 6 (CLIP) ×2 IMPLANT
CLIP TI MEDIUM LARGE 6 (CLIP) ×2 IMPLANT
CNTNR URN SCR LID CUP LEK RST (MISCELLANEOUS) IMPLANT
COVER SURGICAL LIGHT HANDLE (MISCELLANEOUS) ×3 IMPLANT
DERMABOND ADVANCED .7 DNX12 (GAUZE/BANDAGES/DRESSINGS) ×2 IMPLANT
DRAIN CHANNEL 19F RND (DRAIN) ×1 IMPLANT
DRAPE 3/4 80X56 (DRAPES) ×1 IMPLANT
DRAPE INCISE IOBAN 66X45 STRL (DRAPES) IMPLANT
DRAPE SURG IRRIG POUCH 19X23 (DRAPES) ×3 IMPLANT
DRAPE WARM FLUID 44X44 (DRAPES) ×3 IMPLANT
DRSG OPSITE POSTOP 4X10 (GAUZE/BANDAGES/DRESSINGS) IMPLANT
DRSG OPSITE POSTOP 4X6 (GAUZE/BANDAGES/DRESSINGS) IMPLANT
DRSG OPSITE POSTOP 4X8 (GAUZE/BANDAGES/DRESSINGS) ×1 IMPLANT
ELECT REM PT RETURN 15FT ADLT (MISCELLANEOUS) ×3 IMPLANT
EVACUATOR SILICONE 100CC (DRAIN) ×1 IMPLANT
GAUZE 4X4 16PLY ~~LOC~~+RFID DBL (SPONGE) ×2 IMPLANT
GAUZE SPONGE 2X2 8PLY STRL LF (GAUZE/BANDAGES/DRESSINGS) ×1 IMPLANT
GLOVE BIO SURGEON STRL SZ 6 (GLOVE) ×6 IMPLANT
GLOVE BIO SURGEON STRL SZ 6.5 (GLOVE) ×3 IMPLANT
GLOVE BIOGEL PI IND STRL 6.5 (GLOVE) ×3 IMPLANT
GOWN STRL REUS W/ TWL LRG LVL3 (GOWN DISPOSABLE) ×6 IMPLANT
HEMOSTAT ARISTA ABSORB 3G PWDR (HEMOSTASIS) IMPLANT
KIT BASIN OR (CUSTOM PROCEDURE TRAY) ×3 IMPLANT
KIT TURNOVER KIT A (KITS) IMPLANT
LIGASURE IMPACT 36 18CM CVD LR (INSTRUMENTS) ×1 IMPLANT
LOOP VESSEL MAXI BLUE (MISCELLANEOUS) IMPLANT
NDL HYPO 21X1.5 SAFETY (NEEDLE) ×4 IMPLANT
NEEDLE HYPO 21X1.5 SAFETY (NEEDLE) ×6 IMPLANT
NS IRRIG 1000ML POUR BTL (IV SOLUTION) ×4 IMPLANT
PACK GENERAL/GYN (CUSTOM PROCEDURE TRAY) ×3 IMPLANT
RELOAD PROXIMATE 75MM BLUE (ENDOMECHANICALS) IMPLANT
RELOAD PROXIMATE TA60MM BLUE (ENDOMECHANICALS) IMPLANT
RELOAD STAPLE 60 BLU REG PROX (ENDOMECHANICALS) IMPLANT
RELOAD STAPLE 75 3.8 BLU REG (ENDOMECHANICALS) IMPLANT
RETRACTOR WND ALEXIS 18 MED (MISCELLANEOUS) IMPLANT
RETRACTOR WND ALEXIS 25 LRG (MISCELLANEOUS) IMPLANT
RTRCTR WOUND ALEXIS 18CM MED (MISCELLANEOUS) IMPLANT
RTRCTR WOUND ALEXIS 25CM LRG (MISCELLANEOUS) ×3 IMPLANT
SHEET LAVH (DRAPES) ×3 IMPLANT
SLEEVE SUCTION CATH 165 (SLEEVE) ×3 IMPLANT
SOL PREP POV-IOD 4OZ 10% (MISCELLANEOUS) ×3 IMPLANT
STAPLER GUN LINEAR PROX 60 (STAPLE) IMPLANT
STAPLER PROXIMATE 75MM BLUE (STAPLE) IMPLANT
STAPLER SKIN PROX WIDE 3.9 (STAPLE) IMPLANT
SURGIFLO W/THROMBIN 8M KIT (HEMOSTASIS) IMPLANT
SUT ETHILON 3 0 FSL (SUTURE) ×1 IMPLANT
SUT MNCRL AB 4-0 PS2 18 (SUTURE) ×6 IMPLANT
SUT PDS AB 1 TP1 54 (SUTURE) ×6 IMPLANT
SUT SILK 3 0 SH CR/8 (SUTURE) IMPLANT
SUT VIC AB 0 CT1 36 (SUTURE) ×15 IMPLANT
SUT VIC AB 2-0 CT1 36 (SUTURE) ×4 IMPLANT
SUT VIC AB 2-0 CT1 TAPERPNT 27 (SUTURE) ×4 IMPLANT
SUT VIC AB 2-0 CT2 27 (SUTURE) IMPLANT
SUT VIC AB 2-0 SH 18 (SUTURE) IMPLANT
SUT VIC AB 2-0 SH 27X BRD (SUTURE) ×2 IMPLANT
SUT VIC AB 3-0 CTX 36 (SUTURE) IMPLANT
SUT VIC AB 3-0 SH 18 (SUTURE) IMPLANT
SUT VIC AB 3-0 SH 27X BRD (SUTURE) ×2 IMPLANT
SUT VIC AB 4-0 PS2 27 (SUTURE) ×1 IMPLANT
SYR 30ML LL (SYRINGE) ×5 IMPLANT
TOWEL OR 17X26 10 PK STRL BLUE (TOWEL DISPOSABLE) ×3 IMPLANT
TRAY FOLEY MTR SLVR 16FR STAT (SET/KITS/TRAYS/PACK) ×2 IMPLANT
UNDERPAD 30X36 HEAVY ABSORB (UNDERPADS AND DIAPERS) ×3 IMPLANT

## 2023-04-14 NOTE — Progress Notes (Signed)
 [facility] PROGRESS NOTE    Progress Note  Tonya Schneider  FMW:[id number] DOB: [birth year] DOA: 04/09/2023 PCP: Katina Pfeiffer, PA-C     Brief Narrative:   Tonya Schneider is an 68 y.o. female past medical history not significant from sent to the hospital for abdominal pain and colon obstruction, she related started about a month prior to admission with having crampy lower abdominal pain with intermittent diarrhea and constipation 3 days prior to admission she started having severe pain and ongoing watery diarrhea.  Denies any hematochezia in the ED, CT scan of the abdomen pelvis showed large fluid collection about 11 cm, vaginal ultrasound confirmed large cystic mass of the left adnexal region measuring about 11 cm. Assessment/Plan:   Abdominal pain and nausea likely due to 11 cm left cystic  Pelvic mass in female, Also causing distal obstruction of the colon: Patient was placed in N.P.O., NG tube was placed for intermittent suction. General surgery and GYN on board to proceed with surgical intervention on 04/14/2023 CEA 14.8, which is elevated CA125 24. Try to keep potassium greater than 4 magnesium  greater than 2.  Hypokalemia: Continue replete IV recheck in the morning.  Colonic obstruction mechanical due to left cystic mass: Currently with an NG tube. She relates her abdominal pain is improved after NG tube placement. Started on enemas, had a bowel movement. Urinating without any difficulties.  Leukocytosis: Likely reactive improved after NG tube placement.   DVT prophylaxis: lovenox  Family Communication:non Status is: Inpatient Remains inpatient appropriate because: Left cystic pelvic mass    Code Status:     Code Status Orders  (From admission, onward)           Start     Ordered   04/10/23 1338  Full code  Continuous       Question:  By:  Answer:  Consent: discussion documented in EHR   04/10/23 1338           Code Status History      This patient has a current code status but no historical code status.      Advance Directive Documentation    Flowsheet Row Most Recent Value  Type of Advance Directive Healthcare Power of Attorney, Living will  Pre-existing out of facility DNR order (yellow form or pink MOST form) --  MOST Form in Place? --         IV Access:   Peripheral IV   Procedures and diagnostic studies:   MR PELVIS W WO CONTRAST Result Date: 04/12/2023 CLINICAL DATA:  Cystic lesion within the pelvis on CT and ultrasound. EXAM: MRI PELVIS WITHOUT AND WITH CONTRAST TECHNIQUE: Multiplanar multisequence MR imaging of the pelvis was performed both before and after administration of intravenous contrast. CONTRAST:  6mL GADAVIST  GADOBUTROL  1 MMOL/ML IV SOLN COMPARISON:  04/09/2023 CT and ultrasound FINDINGS: Exam is moderate to markedly limited secondary to susceptibility artifact from right hip arthroplasty as well as the extent of colonic gas and stool, causing further susceptibility artifact. Urinary Tract: Urinary bladder is collapsed around a Foley catheter. Lower pole right renal 1.5 cm cyst . In the absence of clinically indicated signs/symptoms require(s) no independent follow-up. Bowel: The low rectum and anus are compressed by the central pelvic process. The upstream colon is dilated and stool-filled. Vascular/Lymphatic: Limited evaluation for pelvic aneurysm or sidewall adenopathy. None present on recent CT. Reproductive: The uterus is likely visualized eccentric left and 15/13. Ovaries not visualized. A complex deep central pelvic floor collection measures 6.8  x 6.8 by 9.8 cm. Example 21/3 and 18/6. Position and shape favor dilated, fluid-filled vagina. No well-defined obstructive mass. Post-contrast images demonstrate no significant enhancement within this collection. Example 45/17. Other:  None. Musculoskeletal: Right hip arthroplasty. IMPRESSION: 1. Moderate to markedly degraded exam secondary to right  hip arthroplasty and the extent of gas and stool. 2. Complex collection within the deep central pelvis, favored to represent hydrocolpos. Consider physical exam correlation. 3. Compressed low rectum and anus by the pelvic collection. Upstream dilated colon suggests a component of obstruction. Electronically Signed   By: Rockey Kilts M.D.   On: 04/12/2023 16:05     Medical Consultants:   None.   Subjective:    Tonya Schneider relates her NG tube continues to bother her.  Objective:    Vitals:   04/13/23 0532 04/13/23 1418 04/13/23 2123 04/14/23 0421  BP: (!) 152/87 (!) 138/90 (!) 151/97 (!) 147/97  Pulse: 94 91 98 93  Resp: 15 20 15 16   Temp: 98.7 F (37.1 C) 98.3 F (36.8 C) 97.8 F (36.6 C)   TempSrc: Oral Oral Oral   SpO2: 96% 98% 96% 98%  Weight:      Height:       SpO2: 98 %   Intake/Output Summary (Last 24 hours) at 04/14/2023 9077 Last data filed at 04/14/2023 0600 Gross per 24 hour  Intake 1388.94 ml  Output 3175 ml  Net -1786.06 ml   Filed Weights   04/09/23 1636  Weight: 60.8 kg    Exam: General exam: In no acute distress, NG tube in place. Respiratory system: Good air movement and clear to auscultation. Cardiovascular system: S1 & S2 heard, RRR. No JVD. Gastrointestinal system: Abdomen is nondistended, soft and nontender.  Extremities: No pedal edema. Skin: No rashes, lesions or ulcers Psychiatry: Judgement and insight appear normal. Mood & affect appropriate. Data Reviewed:    Labs: Basic Metabolic Panel: Recent Labs  Lab 04/09/23 1642 04/11/23 0528 04/13/23 0904 04/14/23 0511  NA 134* 141 143 143  K 3.5 3.4* 3.1* 3.3*  CL 101 108 107 106  CO2 23 23 24 22   GLUCOSE 147* 88 97 98  BUN 20 21 16 13   CREATININE 0.73 0.68 0.49 0.60  CALCIUM 9.0 8.4* 8.4* 8.3*   GFR Estimated Creatinine Clearance: 63.7 mL/min (by C-G formula based on SCr of 0.6 mg/dL). Liver Function Tests: Recent Labs  Lab 04/09/23 1642  AST 11*  ALT 13  ALKPHOS  74  BILITOT 0.8  PROT 7.1  ALBUMIN  3.7   Recent Labs  Lab 04/09/23 1642  LIPASE <10*   No results for input(s): AMMONIA in the last 168 hours. Coagulation profile No results for input(s): INR, PROTIME in the last 168 hours. COVID-19 Labs  No results for input(s): DDIMER, FERRITIN, LDH, CRP in the last 72 hours.  No results found for: SARSCOV2NAA  CBC: Recent Labs  Lab 04/09/23 1642 04/11/23 0528 04/14/23 0511  WBC 16.1* 9.8 9.4  HGB 12.6 11.1* 12.4  HCT 37.7 34.7* 39.5  MCV 85.7 91.3 89.4  PLT 284 252 353   Cardiac Enzymes: No results for input(s): CKTOTAL, CKMB, CKMBINDEX, TROPONINI in the last 168 hours. BNP (last 3 results) No results for input(s): PROBNP in the last 8760 hours. CBG: No results for input(s): GLUCAP in the last 168 hours. D-Dimer: No results for input(s): DDIMER in the last 72 hours. Hgb A1c: No results for input(s): HGBA1C in the last 72 hours. Lipid Profile: No results for input(s): CHOL,  HDL, LDLCALC, TRIG, CHOLHDL, LDLDIRECT in the last 72 hours. Thyroid function studies: No results for input(s): TSH, T4TOTAL, T3FREE, THYROIDAB in the last 72 hours.  Invalid input(s): FREET3 Anemia work up: No results for input(s): VITAMINB12, FOLATE, FERRITIN, TIBC, IRON, RETICCTPCT in the last 72 hours. Sepsis Labs: Recent Labs  Lab 04/09/23 1642 04/11/23 0528 04/14/23 0511  WBC 16.1* 9.8 9.4   Microbiology Recent Results (from the past 240 hours)  Surgical pcr screen     Status: Abnormal   Collection Time: 04/13/23  8:32 PM   Specimen: Nasal Mucosa; Nasal Swab  Result Value Ref Range Status   MRSA, PCR NEGATIVE NEGATIVE Final   Staphylococcus aureus POSITIVE (A) NEGATIVE Final    Comment: (NOTE) The Xpert SA Assay (FDA approved for NASAL specimens in patients 67 years of age and older), is one component of a comprehensive surveillance program. It is not intended to diagnose  infection nor to guide or monitor treatment. Performed at [facility], [address] [place], [place] [address]      Medications:    Chlorhexidine  Gluconate Cloth  6 each Topical Daily   Chlorhexidine  Gluconate Cloth  6 each Topical Daily   heparin  injection (subcutaneous)  5,000 Units Subcutaneous On Call to OR   mupirocin  ointment  1 Application Nasal BID   polyethylene glycol  17 g Per NG tube BID   Continuous Infusions:  0.9 % NaCl with KCl 40 mEq / L 40 mL/hr at 04/13/23 1010    ceFAZolin  (ANCEF ) IV     metronidazole         LOS: 4 days   Tonya Schneider  [facility]  04/14/2023, 9:22 AM

## 2023-04-14 NOTE — Anesthesia Postprocedure Evaluation (Signed)
 Anesthesia Post Note  Patient: Tonya Schneider  Procedure(s) Performed: TOTAL ABDOMINAL HYSTERECTOMY, BILATERAL SALPINGO OOPHORECTOMY ABSCESS DRAINAGE AND DRAIN PLACEMENT     Patient location during evaluation: PACU Anesthesia Type: General Level of consciousness: sedated and patient cooperative Pain management: pain level controlled Vital Signs Assessment: post-procedure vital signs reviewed and stable Respiratory status: spontaneous breathing Cardiovascular status: stable Anesthetic complications: no   No notable events documented.  Last Vitals:  Vitals:   04/14/23 1800 04/14/23 1815  BP: 120/71 121/76  Pulse: 93 95  Resp: 12 12  Temp:    SpO2: 100% 100%    Last Pain:  Vitals:   04/14/23 1800  TempSrc:   PainSc: 4                  Norleen Pope

## 2023-04-14 NOTE — Op Note (Signed)
 GYNECOLOGIC ONCOLOGY OPERATIVE NOTE  Date of Service: 04/09/2023  Preoperative Diagnosis: Pelvic mass, partial large bowel obstruction  Postoperative Diagnosis: Pelvic abscess  Procedures: Total abdominal hysterectomy, bilateral salpingo-oophorectomy, drainage of pelvic abscess cavity, drain placement  Surgeon: Hoy Masters, MD  Assistants: Olam Mill, MD and (an MD assistant was necessary for tissue manipulation, management of robotic instrumentation, retraction and positioning due to the complexity of the case and hospital policies)  Anesthesia: General  Estimated Blood Loss: 20 ml  Fluids: 2400 ml, crystalloid  Urine Output: 360 ml, clear yellow  Findings: On bimanual exam, normal cervix and small uterus that is pulled cephalad.  Deviation of posterior wall of the vagina anteriorly due to cystic mass in the deep pelvis and the rectovaginal space.  On entry to the abdomen, air-filled distended sigmoid colon.  Large firm stool burden in the remainder of the ascending, transverse, and descending colon.  In the pelvis, normal small uterus.  Normal-appearing bilateral fallopian tubes and ovaries.  A inflamed cystic walled off mass was noted in the posterior cul-de-sac, abutting the anterior rectum and extending to the level of the posterior cervix, consistent with a walled off abscess cavity.  This collection extended along the rectovaginal space, anterior to and to the left of the rectum.  Following hysterectomy, the abscess cavity was noted to be separate from the cervix and vagina.  In dissecting the abscess cavity from the posterior wall of the vagina, the collection was ruptured with brown and purulent drainage.  No obvious mass of the rectum or clear diverticula to explain the formation of the abscess.  Small bowel run in its entirety with no abnormality, nondistended. Upper abdominal survey with smooth diaphragm, liver, stomach surfaces. At conclusion of procedure, rectovaginal  exam performed with the vagina no longer deviated anteriorly.  Possible mild stricturing of the proximal anus/distal rectum, possibly due to inflammatory changes from the abscess.  Specimens:  ID Type Source Tests Collected by Time Destination  1 : Uterus, Cervix, Bilateral Tubes and Ovaries Tissue PATH Gyn tumor resection SURGICAL PATHOLOGY Masters Hoy, MD 04/14/2023 1433   2 : Posterior [place] Perineum Tissue PATH Gyn biopsy SURGICAL PATHOLOGY Masters Hoy, MD 04/14/2023 1438   B : Abscess Wall Tissue PATH Soft tissue AEROBIC/ANAEROBIC CULTURE W VONNE STAIN (SURGICAL/DEEP WOUND) Masters Hoy, MD 04/14/2023 1442     Complications:  None  Indications for Procedure: CLETIS MUMA is a 68 y.o. woman with imaging demonstrating a fluid collection in the pelvis causing external compression on the rectum leading to a partial large bowel obstruction.  Prior to the procedure, all risks, benefits, and alternatives were discussed and informed surgical consent was signed.  Procedure: Patient was taken to the operating room where general anesthesia was achieved.  She was positioned in dorsal lithotomy and prepped and draped.  A foley catheter was inserted into the bladder.  A vertical midline incision was made with the scalpel and the abdomen was entered sharply.  The abdomen and pelvis were surveyed with findings as documented above. A wound protector was placed followed by the Bookwalter retractor, packing the bowels into the upper abdomen.   In the pelvis, a cystic collection was noted in the posterior cul-de-sac, with an inflammatory rind suggestive of a walled off abscess, appearing to abut the level of the posterior cervix, and laying on the anterior rectal wall, extending distally into the rectovaginal space to the anterior and left of the rectum.  Her bilateral adnexa were completely free from this collection and  normal in appearance.  Due to the close proximity to her cervix and vagina,  decision made to proceed with hysterectomy.  The right round ligament was transected and the broad ligament opened posteriorly.  The ureter was identified and the infundibulopelvic ligament was isolated, clamped, cauterized with the ligasure, transected, and tied.  The broad ligament was opened anteriorly and the bladder flap was developed.  The uterine artery was skeletonized, clamped, transected, and suture ligated.  An identical procedure was performed on the contralateral side. At this time a sponge stick was placed in the anterior vaginal fornix.  The anterior vagina was incised with electrocautery for colpotomy.  This was then traced circumferentially around the cervix.  The posterior lip of the cervix was grasped and elevated and the rectovaginal septum entered sharply.  The remaining cardinal ligament attachments were cauterized and transected with the LigaSure, and the uterus, cervix, bilateral tubes and ovaries were handed off the field.  The collection was then noted to still be separate from the gynecologic organs that were removed.  The rectovaginal septum was additionally opened with blunt dissection.  The cystic mass was attempted to be dissected off of the posterior vaginal wall with blunt dissection, but in doing so the cystic collection ruptured and was noted to have purulent drainage.  The abscess cavity was further bluntly opened to completely drain.  Portion of the abscess cavity wall was collected for culture. At this time, Dr. Eletha was able to inspect the pelvis and rectum as well with no additional findings. Decision made to plan for antibiotics and drain placement. The vaginal cuff was closed with several figure-of-eight stitches of 0 Vicryl. An additional figure of eight stitch of 2-0 vicryl was placed on the posterior vaginal wall for hemostasis.   The abdomen and pelvis was copiously irrigated and all operative sites were found to be hemostatic. The small and large bowel was  inspected. An adhesion connecting two portions of the transverse colon was separated sharply. The small bowel was run in its entirety. A 19 french JP drain was placed in the pelvic in the abscess cavity and deep into the extension of the rectovaginal septum. The drain was brought out through the left upper abdomen at previously marked site for possible ostomy. The drain was secured to the skin with a nylon.  The fascia was closed with a running stitch of #1 PDS. Exparel  was injected at the incision site in standard fashion. The subcutaneous tissues were irrigated and hemostasis achieved. Two figure of eight stitches were placed in the subcutaneous layer to burry the inferior PDS knot. The skin was closed with a running deep dermal stitch of 4-0 vicryl, then 4-0 monocryl in a subcuticular fashion followed by surgical glue.  Patient tolerated the procedure well. Sponge, lap, and instrument counts were correct.  2 gm of Ancef  and 500mg  of metronidazole  was administered prior to skin incision for routine perioperative antibiotics. She was then started on IV zosyn , to be continued, for treatment of the pelvic abscess. Patient was extubated and taken to the PACU in stable condition.  Hoy Masters, MD Gynecologic Oncology

## 2023-04-14 NOTE — Transfer of Care (Signed)
 Immediate Anesthesia Transfer of Care Note  Patient: Tonya Schneider  Procedure(s) Performed: TOTAL ABDOMINAL HYSTERECTOMY, BILATERAL SALPINGO OOPHORECTOMY ABSCESS DRAINAGE AND DRAIN PLACEMENT  Patient Location: PACU  Anesthesia Type:General  Level of Consciousness: drowsy  Airway & Oxygen Therapy: Patient Spontanous Breathing and Patient connected to face mask oxygen  Post-op Assessment: Report given to RN and Post -op Vital signs reviewed and stable  Post vital signs: Reviewed and stable  Last Vitals:  Vitals Value Taken Time  BP 147/97 04/14/23 1627  Temp    Pulse 100 04/14/23 1629  Resp 15 04/14/23 1629  SpO2 100 % 04/14/23 1629  Vitals shown include unfiled device data.  Last Pain:  Vitals:   04/14/23 1208  TempSrc:   PainSc: 0-No pain      Patients Stated Pain Goal: 2 (04/14/23 0740)  Complications: No notable events documented.

## 2023-04-14 NOTE — Progress Notes (Signed)
 Mobility Specialist - Progress Note   04/14/23 0930  Mobility  Activity Ambulated with assistance in hallway  Level of Assistance Modified independent, requires aide device or extra time  Assistive Device Front wheel walker  Distance Ambulated (ft) 500 ft  Activity Response Tolerated well  Mobility Referral Yes  Mobility visit 1 Mobility  Mobility Specialist Start Time (ACUTE ONLY) 0908  Mobility Specialist Stop Time (ACUTE ONLY) 0929  Mobility Specialist Time Calculation (min) (ACUTE ONLY) 21 min   Pt received in bed and agreeable to mobility. No complaints during session. Pt to recliner after session with all needs met.    [facility]

## 2023-04-14 NOTE — Anesthesia Preprocedure Evaluation (Addendum)
Anesthesia Evaluation  Patient identified by MRN, date of birth, ID band Patient awake    Reviewed: Allergy & Precautions, NPO status , Patient's Chart, lab work & pertinent test results  Airway Mallampati: II  TM Distance: <3 FB Neck ROM: Limited    Dental  (+) Dental Advisory Given, Teeth Intact   Pulmonary neg pulmonary ROS   Pulmonary exam normal breath sounds clear to auscultation       Cardiovascular negative cardio ROS Normal cardiovascular exam Rhythm:Regular Rate:Normal     Neuro/Psych negative neurological ROS     GI/Hepatic negative GI ROS, Neg liver ROS,,,  Endo/Other  negative endocrine ROS    Renal/GU negative Renal ROS     Musculoskeletal negative musculoskeletal ROS (+)    Abdominal   Peds  Hematology negative hematology ROS (+)   Anesthesia Other Findings   Reproductive/Obstetrics                             Anesthesia Physical Anesthesia Plan  ASA: 3  Anesthesia Plan: General   Post-op Pain Management: Tylenol PO (pre-op)* and Gabapentin PO (pre-op)*   Induction: Intravenous, Rapid sequence and Cricoid pressure planned  PONV Risk Score and Plan: 4 or greater and Ondansetron, Dexamethasone and Treatment may vary due to age or medical condition  Airway Management Planned: Oral ETT and Video Laryngoscope Planned  Additional Equipment:   Intra-op Plan:   Post-operative Plan: Extubation in OR  Informed Consent: I have reviewed the patients History and Physical, chart, labs and discussed the procedure including the risks, benefits and alternatives for the proposed anesthesia with the patient or authorized representative who has indicated his/her understanding and acceptance.     Dental advisory given  Plan Discussed with: CRNA  Anesthesia Plan Comments: (2 x PIV)        Anesthesia Quick Evaluation

## 2023-04-14 NOTE — Anesthesia Procedure Notes (Addendum)
 Procedure Name: Intubation Date/Time: 04/14/2023 1:12 PM  Performed by: Harl Armida PARAS, CRNAPre-anesthesia Checklist: Patient identified, Emergency Drugs available, Suction available, Patient being monitored and Timeout performed Patient Re-evaluated:Patient Re-evaluated prior to induction Oxygen Delivery Method: Circle system utilized Preoxygenation: Pre-oxygenation with 100% oxygen Induction Type: IV induction Ventilation: Mask ventilation without difficulty Laryngoscope Size: Mac, 3 and Glidescope Grade View: Grade I Tube type: Oral Tube size: 7.5 mm Number of attempts: 1 Airway Equipment and Method: Stylet and Video-laryngoscopy Placement Confirmation: ETT inserted through vocal cords under direct vision, positive ETCO2 and breath sounds checked- equal and bilateral Secured at: 22 cm Tube secured with: Tape Dental Injury: Teeth and Oropharynx as per pre-operative assessment

## 2023-04-14 NOTE — Progress Notes (Signed)
 Inpatient Gyn Onc Progress Note  Subjective: Overall stable this morning. Anxious to get procedure completed today.   Objective: Vital signs in last 24 hours: Temp:  [97.5 F (36.4 C)-98.3 F (36.8 C)] 97.5 F (36.4 C) (02/04 1138) Pulse Rate:  [86-98] 86 (02/04 1138) Resp:  [15-20] 16 (02/04 1138) BP: (138-151)/(89-97) 147/89 (02/04 1138) SpO2:  [96 %-98 %] 97 % (02/04 1138) Last BM Date : 04/12/23  Intake/Output from previous day: 02/03 0701 - 02/04 0700 In: 1388.9 [P.O.:190; I.V.:828.2; IV Piggyback:370.8] Out: 3175 [Urine:1925; Emesis/NG output:1250]  Physical Examination: General: Alert, oriented, no acute distress. HEENT: Normocephalic, atraumatic. Neck symmetric without masses. Sclera anicteric. NGT in place with bilious output in cannister Chest: Normal work of breathing.  Cardiovascular: Regular rate Abdomen: Distended, rounded, tympanitic. Mild to moderate TTP.  Extremities: Warm, well perfused.  No edema bilaterally. SCDs in place Skin: No rashes or lesions noted. GU: foley in place with yellow urine in bag  Labs: WBC/Hgb/Hct/Plts:  9.4/12.4/39.5/353 (02/04 9488) BUN/Cr/glu/ALT/AST/amyl/lip:  13/0.60/--/--/--/--/-- (02/04 0511) CEA: 14.8 (elevated) CA125: 24.6 (normal)  Assessment: 68 y.o. a 13cm cystic pelvic mass that appears to be causing external compression on the rectum leading to a partial obstructive process and obstipation. Continues to have occasional flatus and reports some stool output with current regimen. NGT with bilious output. Overall clinically stable.   CA125 is normal and CEA is elevated. MRI reviewed. Given her partial obstructive process and obstipation, preoperative evaluation with colonoscopy is likely unable to occur. Does report last colonoscopy in 2017 was normal with plan for 10 year follow-up.   Recommendations: Surgery today as planned   LOS: 4 days    Kunal Levario 04/14/2023, 12:36 PM

## 2023-04-15 ENCOUNTER — Encounter (HOSPITAL_COMMUNITY): Payer: Self-pay | Admitting: Psychiatry

## 2023-04-15 DIAGNOSIS — Z9079 Acquired absence of other genital organ(s): Secondary | ICD-10-CM

## 2023-04-15 DIAGNOSIS — K56609 Unspecified intestinal obstruction, unspecified as to partial versus complete obstruction: Secondary | ICD-10-CM

## 2023-04-15 DIAGNOSIS — Z90722 Acquired absence of ovaries, bilateral: Secondary | ICD-10-CM

## 2023-04-15 DIAGNOSIS — N739 Female pelvic inflammatory disease, unspecified: Secondary | ICD-10-CM | POA: Diagnosis not present

## 2023-04-15 DIAGNOSIS — Z90710 Acquired absence of both cervix and uterus: Secondary | ICD-10-CM | POA: Diagnosis not present

## 2023-04-15 DIAGNOSIS — E876 Hypokalemia: Secondary | ICD-10-CM

## 2023-04-15 LAB — BASIC METABOLIC PANEL
Anion gap: 15 (ref 5–15)
BUN: 16 mg/dL (ref 8–23)
CO2: 25 mmol/L (ref 22–32)
Calcium: 8.4 mg/dL — ABNORMAL LOW (ref 8.9–10.3)
Chloride: 104 mmol/L (ref 98–111)
Creatinine, Ser: 0.63 mg/dL (ref 0.44–1.00)
GFR, Estimated: >60 mL/min (ref >60)
Glucose, Bld: 138 mg/dL — ABNORMAL HIGH (ref 70–99)
Potassium: 4 mmol/L (ref 3.5–5.1)
Sodium: 144 mmol/L (ref 135–145)

## 2023-04-15 LAB — CBC
HCT: 41.7 % (ref 36.0–46.0)
Hemoglobin: 13.2 g/dL (ref 12.0–15.0)
MCH: 28.4 pg (ref 26.0–34.0)
MCHC: 31.7 g/dL (ref 30.0–36.0)
MCV: 89.7 fL (ref 80.0–100.0)
Platelets: 445 10*3/uL — ABNORMAL HIGH (ref 150–400)
RBC: 4.65 MIL/uL (ref 3.87–5.11)
RDW: 13.6 % (ref 11.5–15.5)
WBC: 18.9 10*3/uL — ABNORMAL HIGH (ref 4.0–10.5)
nRBC: 0 % (ref 0.0–0.2)

## 2023-04-15 LAB — HEPATIC FUNCTION PANEL
ALT: 15 U/L (ref 0–44)
AST: 14 U/L — ABNORMAL LOW (ref 15–41)
Albumin: 3.1 g/dL — ABNORMAL LOW (ref 3.5–5.0)
Alkaline Phosphatase: 65 U/L (ref 38–126)
Bilirubin, Direct: 0.1 mg/dL (ref 0.0–0.2)
Indirect Bilirubin: 0.9 mg/dL (ref 0.3–0.9)
Total Bilirubin: 1 mg/dL (ref 0.0–1.2)
Total Protein: 6.4 g/dL — ABNORMAL LOW (ref 6.5–8.1)

## 2023-04-15 MED ORDER — OXYCODONE HCL 5 MG/5ML PO SOLN: 5.0000 mg | Freq: Four times a day (QID) | ORAL | Status: DC | PRN

## 2023-04-15 MED ORDER — HYDROMORPHONE HCL 1 MG/ML IJ SOLN
0.5000 mg | INTRAMUSCULAR | Status: DC | PRN
  Administered 2023-04-15 – 2023-04-17 (×2): 0.5 mg via INTRAVENOUS
  Filled 2023-04-15 (×2): qty 0.5

## 2023-04-15 MED ORDER — HYDROMORPHONE HCL 1 MG/ML IJ SOLN
1.0000 mg | INTRAMUSCULAR | Status: DC | PRN
  Administered 2023-04-15 – 2023-04-18 (×2): 1 mg via INTRAVENOUS
  Filled 2023-04-15 (×4): qty 1

## 2023-04-15 MED ORDER — BOOST / RESOURCE BREEZE PO LIQD CUSTOM: 1.0000 | Freq: Two times a day (BID) | ORAL | Status: DC

## 2023-04-15 MED ORDER — ADULT MULTIVITAMIN W/MINERALS CH
1.0000 | ORAL_TABLET | Freq: Every day | ORAL | Status: DC
  Administered 2023-04-16 – 2023-04-21 (×6): 1 via ORAL
  Filled 2023-04-15 (×6): qty 1

## 2023-04-15 NOTE — Progress Notes (Signed)
 Progress Note  1 Day Post-Op  Subjective: Have intermittent lower abdominal pain which is similar to preop. Ambulating. No nausea/vomiting. Still bloated  Husband at bedside  Objective: Vital signs in last 24 hours: Temp:  [97.4 F (36.3 C)-98.1 F (36.7 C)] 97.4 F (36.3 C) (02/05 0615) Pulse Rate:  [92-116] 92 (02/05 0615) Resp:  [12-19] 19 (02/05 0615) BP: (120-147)/(68-98) 128/88 (02/05 0615) SpO2:  [89 %-100 %] 95 % (02/05 0615) Last BM Date : 04/12/23  Intake/Output from previous day: 02/04 0701 - 02/05 0700 In: 3250 [I.V.:2400; IV Piggyback:850] Out: 1765 [Urine:1360; Drains:350; Blood:5] Intake/Output this shift: Total I/O In: -  Out: 290 [Urine:200; Drains:90]  PE: General: pleasant, WD, female who is sitting up in NAD HEENT: head is normocephalic, atraumatic.  Sclera are noninjected.  Pupils equal and round. EOMs intact.  Ears and nose without any masses or lesions.  Mouth is pink and moist Lungs: Respiratory effort nonlabored Abd: soft, mild TTP over incision which is cdi. Mild to moderate distension. JP sanguinous serous MSK: all 4 extremities are symmetrical with no cyanosis, clubbing, or edema. Skin: warm and dry Psych: A&Ox3 with an appropriate affect.    Lab Results:  Recent Labs    04/14/23 0511 04/15/23 0457  WBC 9.4 18.9*  HGB 12.4 13.2  HCT 39.5 41.7  PLT 353 445*   BMET Recent Labs    04/14/23 0511 04/15/23 0457  NA 143 144  K 3.3* 4.0  CL 106 104  CO2 22 25  GLUCOSE 98 138*  BUN 13 16  CREATININE 0.60 0.63  CALCIUM 8.3* 8.4*   PT/INR No results for input(s): LABPROT, INR in the last 72 hours. CMP     Component Value Date/Time   NA 144 04/15/2023 0457   K 4.0 04/15/2023 0457   CL 104 04/15/2023 0457   CO2 25 04/15/2023 0457   GLUCOSE 138 (H) 04/15/2023 0457   BUN 16 04/15/2023 0457   CREATININE 0.63 04/15/2023 0457   CALCIUM 8.4 (L) 04/15/2023 0457   PROT 7.1 04/09/2023 1642   ALBUMIN  3.7 04/09/2023 1642   AST  11 (L) 04/09/2023 1642   ALT 13 04/09/2023 1642   ALKPHOS 74 04/09/2023 1642   BILITOT 0.8 04/09/2023 1642   GFRNONAA >60 04/15/2023 0457   Lipase     Component Value Date/Time   LIPASE <10 (L) 04/09/2023 1642       Studies/Results: No results found.  Anti-infectives: Anti-infectives (From admission, onward)    Start     Dose/Rate Route Frequency Ordered Stop   04/14/23 2300  piperacillin -tazobactam (ZOSYN ) IVPB 3.375 g        3.375 g 12.5 mL/hr over 240 Minutes Intravenous Every 8 hours 04/14/23 1840     04/14/23 1500  piperacillin -tazobactam (ZOSYN ) IVPB 3.375 g  Status:  Discontinued        3.375 g 12.5 mL/hr over 240 Minutes Intravenous  Once 04/14/23 1450 04/14/23 1838   04/14/23 1443  piperacillin -tazobactam (ZOSYN ) 3.375 GM/50ML IVPB       Note to Pharmacy: Adrien Cousin: cabinet override      04/14/23 1443 04/15/23 0259   04/14/23 0600  ceFAZolin  (ANCEF ) IVPB 2g/100 mL premix        2 g 200 mL/hr over 30 Minutes Intravenous On call to O.R. 04/13/23 1701 04/14/23 1309   04/14/23 0600  metroNIDAZOLE  (FLAGYL ) IVPB 500 mg        500 mg 100 mL/hr over 60 Minutes Intravenous On call to O.R. 04/13/23  1701 04/14/23 1329        Assessment/Plan  Pelvic mass, partial large bowel obstruction   POD1 TAH b/l salpingo-oophorectomy, drainage of pelvic abscess cavity Dr. Eldonna 2/4  Drain SS. Mobilizing. No bowel function yet post op - did pass some flatus pre op yesterday NGT out. Continue NPO and AROBF AF, WBC 18.9 post op. Cont IV abx and trend  FEN: NPO, IVF per primary ID: rocephin /flagyl  periop, zosyn  2/4>>  VTE: lovenox   I reviewed Consultant gyn/onc notes, last 24 h vitals and pain scores, last 48 h intake and output, last 24 h labs and trends, and last 24 h imaging results.     LOS: 5 days   [place] VEAR Mais, [facility] 04/15/2023, 12:17 PM Please see Amion for pager number during day hours 7:00am-4:30pm

## 2023-04-15 NOTE — Progress Notes (Addendum)
 Inpatient Gyn Onc Progress Note  Subjective: Pt reports recently ambulating in the halls with her husband. Has occasional sharp pains similar to preop. No flatus, no BM. No nausea or emesis reported. No fever-like symptoms. Patient reports last good meal being over a week ago. No needs voiced. Questions answered about surgery.    Objective: Vital signs in last 24 hours: Temp:  [97.4 F (36.3 C)-98.1 F (36.7 C)] 97.4 F (36.3 C) (02/05 0615) Pulse Rate:  [86-116] 92 (02/05 0615) Resp:  [12-19] 19 (02/05 0615) BP: (120-147)/(68-98) 128/88 (02/05 0615) SpO2:  [89 %-100 %] 95 % (02/05 0615) Last BM Date : 04/12/23  Intake/Output from previous day: 02/04 0701 - 02/05 0700 In: 3250 [I.V.:2400; IV Piggyback:850] Out: 1765 [Urine:1360; Drains:350; Blood:5]  Physical Examination: General: Alert, oriented, no acute distress. HEENT: Normocephalic, atraumatic. Sclera anicteric.  Chest: Normal work of breathing. Lungs clear.  Cardiovascular: Regular rate and rhythm. Abdomen: Distended, rounded, tympanitic. Mild TTP. Mildly hypoactive bowel sounds. Abdominal midline incision with op site dressing intact with no drainage noted underneath. Surgical drain with sanguinous output, slightly cloudy Extremities: Warm, well perfused.  No edema bilaterally. SCDs in place Skin: No rashes or lesions noted. GU: foley in place with yellow urine in bag  Labs: WBC/Hgb/Hct/Plts:  18.9/13.2/41.7/445 (02/05 0457) BUN/Cr/glu/ALT/AST/amyl/lip:  16/0.63/--/--/--/--/-- (02/05 0457) CEA: 14.8 (elevated) CA125: 24.6 (normal)  Assessment/Plan: 68 y.o. s/p total abdominal hysterectomy, bilateral salpingo-oophorectomy, drainage of pelvic abscess cavity, drain placement on 04/14/23 with Dr. Hoy Masters. Stable post-operatively. Currently NPO with diet changes per [facility] Team. Continue currently ordered zosyn . Encouraged ambulation. Nutritionist consult per Dr. JERELD. Continue with current plan of care.      LOS: 5 days     Tonya Schneider 04/15/2023, 11:33 AM

## 2023-04-15 NOTE — Assessment & Plan Note (Signed)
 Replaced

## 2023-04-15 NOTE — Hospital Course (Addendum)
 Brief Narrative:   68 y.o. female without significant past medical history sent to the hospital for abdominal pain and colon obstruction, she related started about a month prior to admission with having crampy lower abdominal pain with intermittent diarrhea and constipation 3 days prior to admission she started having severe pain and ongoing watery diarrhea.  CT scan of the abdomen pelvis showed large fluid collection about 11 cm, vaginal ultrasound confirmed large cystic mass of the left adnexal region measuring about 11 cm.    Gynecology oncology and general surgery were both consulted.  Dr. Eldonna with gynecology performed total abdominal hysterectomy with bilateral salpingo-oophorectomy and drainage of pelvic abscess with drain placement on 2/4.   Patient has since been on IV Zosyn  followed by Ceftriaxone  and Flagyl  with intent to treat with total of 3 weeks of abx.  Post-op course has been complicated by dysmotility however patient has finally begun moving her bowels on several laxatives.    Eventually patient underwent colonoscopy on 2/12.  Overall was a poor prep but biopsies were taken and internal hemorrhoids were noted.  Cleared by GI for discharge.  Will transition to p.o. antibiotics.  Drain will likely be pulled by OB/GYN providers prior to discharge.  Assessment & Plan:  Principal Problem:   Pelvic abscess in female Active Problems:   Colonic obstruction (HCC)   Hypokalemia   S/P TAH-BSO (total abdominal hysterectomy and bilateral salpingo-oophorectomy)   Pelvic abscess in female Patient is status post operative intervention with TAH/BSO and drainage of pelvic abscess on 2/4 by Dr. Eldonna as noted above.  IntraOp cultures growing E. coli, adjust antibiotics accordingly.  Diet as tolerated.  Pain control per general surgery.  Total antibiotic course 3 weeks, EOT 2/25. Will change to PO Cipro  flagyl  after C scope.  [facility] planning colonoscopy 2/12   S/P TAH-BSO (total abdominal  hysterectomy and bilateral salpingo-oophorectomy) Biopsies negative for malignancy.  Seen by GYN oncology, will pull out the drain prior to her discharge  Colonic obstruction (HCC) Secondary to mass effect, status post operative intervention.  [facility] performed colonoscopy 2/12.  Results as above.  Hypokalemia replaced   DVT prophylaxis: Lovenox     Code Status: Full Code Family Communication:   Status is: Inpatient Remains inpatient appropriate because: Discharge today  Subjective: No complaints doing well.  Examination:  General exam: Appears calm and comfortable  Respiratory system: Clear to auscultation. Respiratory effort normal. Cardiovascular system: S1 & S2 heard, RRR. No JVD, murmurs, rubs, gallops or clicks. No pedal edema. Gastrointestinal system: Abdomen is nondistended, soft and nontender. No organomegaly or masses felt. Normal bowel sounds heard. Central nervous system: Alert and oriented. No focal neurological deficits. Extremities: Symmetric 5 x 5 power. Skin: No rashes, lesions or ulcers Psychiatry: Judgement and insight appear normal. Mood & affect appropriate.

## 2023-04-15 NOTE — Assessment & Plan Note (Signed)
 Patient is status post operative intervention with TAH/BSO and drainage of pelvic abscess on 2/4 by Dr. Eldonna as noted above Continue intravenous Zosyn  Following surgical pathology and culture results Currently n.p.o., patient may only have small amounts of ice chips or clear liquids.  Will defer advancement to full clear liquid diet to general surgery.   As needed opiate-based analgesics for substantial associated pain

## 2023-04-15 NOTE — Progress Notes (Signed)
 PROGRESS NOTE   Tonya Schneider  FMW:[id number] DOB: 28-Jan-1956 DOA: 04/09/2023 PCP: Katina Pfeiffer, PA-C   Date of Service: the patient was seen and examined on 04/15/2023  Brief Narrative:  68 y.o. female without significant past medical history sent to the hospital for abdominal pain and colon obstruction, she related started about a month prior to admission with having crampy lower abdominal pain with intermittent diarrhea and constipation 3 days prior to admission she started having severe pain and ongoing watery diarrhea.  CT scan of the abdomen pelvis showed large fluid collection about 11 cm, vaginal ultrasound confirmed large cystic mass of the left adnexal region measuring about 11 cm.   Gynecology oncology and general surgery were both consulted.  Dr. Eldonna with gynecology performed total abdominal hysterectomy with bilateral salpingo-oophorectomy and drainage of pelvic abscess with drain placement on 2/4.   Assessment & Plan Pelvic abscess in female Patient is status post operative intervention with TAH/BSO and drainage of pelvic abscess on 2/4 by Dr. Eldonna as noted above Continue intravenous Zosyn  Following surgical pathology and culture results Currently n.p.o., patient may only have small amounts of ice chips or clear liquids.  Will defer advancement to full clear liquid diet to general surgery.   As needed opiate-based analgesics for substantial associated pain  S/P TAH-BSO (total abdominal hysterectomy and bilateral salpingo-oophorectomy) Gynecology oncology and general surgery both following Remainder of assessment and plan as above Colonic obstruction (HCC) Secondary to mass effect, status post operative intervention Patient has yet to pass flatus or exhibit bowel movements Will defer decision on initiation of clear liquid diet and advancement of diet to general surgery Hypokalemia Replaced   Subjective:  Patient complaining of severe abdominal pain, sharp  in quality, intermittent, occurring in haphazard fashion.  Patient apprehensive about using opiate-based analgesics.  Patient reports not passing flatus.  Physical Exam:  Vitals:   04/14/23 2040 04/15/23 0615 04/15/23 1231 04/15/23 2150  BP: (!) 129/90 128/88 136/87 139/89  Pulse: (!) 102 92 96 (!) 103  Resp: 16 19 18 19   Temp: 98.1 F (36.7 C) (!) 97.4 F (36.3 C) 98.1 F (36.7 C) 97.9 F (36.6 C)  TempSrc: Oral Oral Oral Oral  SpO2: 98% 95% 97% 97%  Weight:      Height:        Constitutional: Awake alert and oriented x3, no associated distress.   Skin: Anterior abdominal wall incision clean dry and intact dressing. Eyes: Pupils are equally reactive to light.  No evidence of scleral icterus or conjunctival pallor.  ENMT: Moist mucous membranes noted.  Posterior pharynx clear of any exudate or lesions.   Respiratory: clear to auscultation bilaterally, no wheezing, no crackles. Normal respiratory effort. No accessory muscle use.  Cardiovascular: Regular rate and rhythm, no murmurs / rubs / gallops. No extremity edema. 2+ pedal pulses. No carotid bruits.  Abdomen: Diffuse tender abdomen.  Abdomen is soft with hypoactive bowel sounds. Musculoskeletal: No joint deformity upper and lower extremities. Good ROM, no contractures. Normal muscle tone.    Data Reviewed:  I have personally reviewed and interpreted labs, imaging.  Significant findings are   CBC: Recent Labs  Lab 04/09/23 1642 04/11/23 0528 04/14/23 0511 04/15/23 0457  WBC 16.1* 9.8 9.4 18.9*  HGB 12.6 11.1* 12.4 13.2  HCT 37.7 34.7* 39.5 41.7  MCV 85.7 91.3 89.4 89.7  PLT 284 252 353 445*   Basic Metabolic Panel: Recent Labs  Lab 04/09/23 1642 04/11/23 0528 04/13/23 9095 04/14/23 0511 04/15/23 0457  NA 134* 141 143 143 144  K 3.5 3.4* 3.1* 3.3* 4.0  CL 101 108 107 106 104  CO2 23 23 24 22 25   GLUCOSE 147* 88 97 98 138*  BUN 20 21 16 13 16   CREATININE 0.73 0.68 0.49 0.60 0.63  CALCIUM 9.0 8.4* 8.4*  8.3* 8.4*   GFR: Estimated Creatinine Clearance: 63.7 mL/min (by C-G formula based on SCr of 0.63 mg/dL). Liver Function Tests: Recent Labs  Lab 04/09/23 1642 04/15/23 1353  AST 11* 14*  ALT 13 15  ALKPHOS 74 65  BILITOT 0.8 1.0  PROT 7.1 6.4*  ALBUMIN  3.7 3.1*    Code Status:  Full code.  Code status decision has been confirmed with: patient Family Communication: Spouse is at bedside who has been updated on plan of care.   Severity of Illness:  The appropriate patient status for this patient is INPATIENT. Inpatient status is judged to be reasonable and necessary in order to provide the required intensity of service to ensure the patient's safety. The patient's presenting symptoms, physical exam findings, and initial radiographic and laboratory data in the context of their chronic comorbidities is felt to place them at high risk for further clinical deterioration. Furthermore, it is not anticipated that the patient will be medically stable for discharge from the hospital within 2 midnights of admission.   * I certify that at the point of admission it is my clinical judgment that the patient will require inpatient hospital care spanning beyond 2 midnights from the point of admission due to high intensity of service, high risk for further deterioration and high frequency of surveillance required.*  Time spent:  50 minutes  Author:  Zachary JINNY Ba MD  04/15/2023 10:54 PM

## 2023-04-15 NOTE — Progress Notes (Signed)
 Initial Nutrition Assessment  DOCUMENTATION CODES:   Not applicable  INTERVENTION:  - Clear Liquid diet per MD.  - Boost Breeze po BID, each supplement provides 250 kcal and 9 grams of protein  - Once diet advanced further, recommend change to Ensure Plus High Protein po BID, each supplement provides 350 kcal and 20 grams of protein. - Multivitamin with minerals daily - Monitor weight trends.   NUTRITION DIAGNOSIS:   Inadequate oral intake related to acute illness (pelvic mass with colonic obstruction) as evidenced by energy intake < 75% for > 7 days.  GOAL:   Patient will meet greater than or equal to 90% of their needs  MONITOR:   PO intake, Supplement acceptance, Diet advancement, Weight trends  REASON FOR ASSESSMENT:   Consult Assessment of nutrition requirement/status, Other (Comment) (Post-op patient, partial obstruction preop from pelvic collection, last adequate meal per pt over one week ago.)  ASSESSMENT:   68 y.o. female with no significant PMH who presented  with abdominal pain and admitted for pelvic mass and colonic obstruction.  1/30 Admit; NGT placed, NPO 2/4 s/p total abdominal hysterectomy, bilateral salpingo-oophorectomy; NGT removed 2/5 CLD  Patient reports a UBW of 135# and stable weight up until the past ~ 1 week due to not having eaten for 9 days.  Per EMR, no weight history prior to this admission.   She endorses typically eating very well at home. Usually eats a big bowl of cereal (with 4 cereal mixed together) for breakfast, a turkey and cheese sandwich on a bagel with pita chips for lunch, and a homemade meal from her husband for dinner. 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    Patient notes her abdominal pain and diarrhea started around Christmas time and have progressively worsened until it became too severe and she presented to the hospital last week.  She endorses not having had anything to eat for 9 days. Has been NPO  since admission.  Diet advanced to clear liquids today. She is eager to start being able to eat/drink something but understands she should start slow.  She is agreeable to try Boost Breeze but admits she doesn't like berry flavored things. Once advanced to full liquids would prefer Ensure.    Admit weight: 134# Current weight: 138# - bed weight taken today during visit, suspect elevated d/t blankets/pillows   Medications reviewed and include: Miralax   Labs reviewed:  -   NUTRITION - FOCUSED PHYSICAL EXAM:  Will attempt at next visit  Diet Order:   Diet Order             Diet clear liquid Room service appropriate? Yes; Fluid consistency: Thin  Diet effective now                   EDUCATION NEEDS:  Education needs have been addressed  Skin:  Skin Assessment: Skin Integrity Issues: Skin Integrity Issues:: Other (Comment) Other: Abdomen  Last BM:  2/2  Height:  Ht Readings from Last 1 Encounters:  04/11/23 5' 8.5 (1.74 m)   Weight:  Wt Readings from Last 1 Encounters:  04/09/23 60.8 kg    BMI:  Body mass index is 20.08 kg/m.  Estimated Nutritional Needs:  Kcal:  1700-1950 kcals Protein:  80-100 grams Fluid:  >/= 1.7L    Trude Ned RD, LDN Contact via Secure Chat.

## 2023-04-15 NOTE — Assessment & Plan Note (Signed)
 Secondary to mass effect, status post operative intervention Patient has yet to pass flatus or exhibit bowel movements Will defer decision on initiation of clear liquid diet and advancement of diet to general surgery

## 2023-04-15 NOTE — Assessment & Plan Note (Signed)
 Gynecology oncology and general surgery both following Remainder of assessment and plan as above

## 2023-04-16 DIAGNOSIS — N739 Female pelvic inflammatory disease, unspecified: Secondary | ICD-10-CM | POA: Diagnosis not present

## 2023-04-16 DIAGNOSIS — Z90710 Acquired absence of both cervix and uterus: Secondary | ICD-10-CM | POA: Diagnosis not present

## 2023-04-16 DIAGNOSIS — K56609 Unspecified intestinal obstruction, unspecified as to partial versus complete obstruction: Secondary | ICD-10-CM | POA: Diagnosis not present

## 2023-04-16 DIAGNOSIS — E876 Hypokalemia: Secondary | ICD-10-CM | POA: Diagnosis not present

## 2023-04-16 LAB — BASIC METABOLIC PANEL
Anion gap: 14 (ref 5–15)
BUN: 22 mg/dL (ref 8–23)
CO2: 26 mmol/L (ref 22–32)
Calcium: 8.5 mg/dL — ABNORMAL LOW (ref 8.9–10.3)
Chloride: 99 mmol/L (ref 98–111)
Creatinine, Ser: 0.83 mg/dL (ref 0.44–1.00)
GFR, Estimated: >60 mL/min (ref >60)
Glucose, Bld: 127 mg/dL — ABNORMAL HIGH (ref 70–99)
Potassium: 3.7 mmol/L (ref 3.5–5.1)
Sodium: 139 mmol/L (ref 135–145)

## 2023-04-16 LAB — SURGICAL PATHOLOGY

## 2023-04-16 MED ORDER — METRONIDAZOLE 500 MG/100ML IV SOLN
500.0000 mg | Freq: Two times a day (BID) | INTRAVENOUS | Status: AC
Start: 2023-04-16 — End: ?
  Administered 2023-04-16 – 2023-04-21 (×12): 500 mg via INTRAVENOUS
  Filled 2023-04-16 (×12): qty 100

## 2023-04-16 MED ORDER — DOCUSATE SODIUM 100 MG PO CAPS
100.0000 mg | ORAL_CAPSULE | Freq: Two times a day (BID) | ORAL | Status: DC
  Administered 2023-04-16 – 2023-04-21 (×10): 100 mg via ORAL
  Filled 2023-04-16 (×12): qty 1

## 2023-04-16 MED ORDER — ENSURE ENLIVE PO LIQD
237.0000 mL | Freq: Two times a day (BID) | ORAL | Status: DC
  Administered 2023-04-16 – 2023-04-22 (×8): 237 mL via ORAL

## 2023-04-16 MED ORDER — SODIUM CHLORIDE 0.9 % IV SOLN
2.0000 g | INTRAVENOUS | Status: DC
  Administered 2023-04-16 – 2023-04-21 (×6): 2 g via INTRAVENOUS
  Filled 2023-04-16 (×6): qty 20

## 2023-04-16 MED ORDER — POLYETHYLENE GLYCOL 3350 17 G PO PACK
17.0000 g | PACK | Freq: Two times a day (BID) | ORAL | Status: DC
  Administered 2023-04-16 – 2023-04-21 (×9): 17 g via ORAL
  Filled 2023-04-16 (×10): qty 1

## 2023-04-16 MED ORDER — OXYCODONE HCL 5 MG PO TABS
2.5000 mg | ORAL_TABLET | ORAL | Status: DC | PRN
  Filled 2023-04-16: qty 1

## 2023-04-16 NOTE — Progress Notes (Signed)
 Inpatient Gyn Onc Progress Note  Subjective: Pt reports doing well. No nausea or emesis. Sipping on miralax  slowly. Ambulating in the halls. Passed flatus this am and had a small amount of loose stool. Abdominal pain is manageable. No fever like symptoms but reports feeling cold and wanting to get back in bed. No chest pain, dyspnea. No needs voiced.  Objective: Vital signs in last 24 hours: Temp:  [97.9 F (36.6 C)-98.8 F (37.1 C)] 98.8 F (37.1 C) (02/06 0635) Pulse Rate:  [96-103] 97 (02/06 0635) Resp:  [18-19] 19 (02/06 0635) BP: (127-139)/(87-90) 127/90 (02/06 0635) SpO2:  [95 %-97 %] 95 % (02/06 0635) Last BM Date : 04/12/23  Intake/Output from previous day: 02/05 0701 - 02/06 0700 In: 290 [P.O.:240; IV Piggyback:50] Out: 965 [Urine:675; Drains:290]  Physical Examination: General: Alert, oriented, no acute distress. HEENT: Normocephalic, atraumatic. Sclera anicteric.  Chest: Normal work of breathing. Lungs clear.  Cardiovascular: Regular rate and rhythm. Abdomen: Distended (slightly decreased from 2/5 assessment), rounded, tympanitic, soft. Mild TTP. Active bowel sounds. Abdominal midline incision with op site dressing intact with no drainage noted underneath. Surgical drain with sanguinous output Extremities: Warm, well perfused.  No edema bilaterally.  Skin: No rashes or lesions noted. GU: foley in place with dark yellow urine in bag  Labs:   BUN/Cr/glu/ALT/AST/amyl/lip:  22/0.83/--/--/--/--/-- (02/06 0531) CEA: 14.8 (elevated) CA125: 24.6 (normal) Abundant E coli on abscess wall taken intra-op  Assessment/Plan: 68 y.o. s/p total abdominal hysterectomy, bilateral salpingo-oophorectomy, drainage of pelvic abscess cavity, drain placement on 04/14/23 with Dr. Hoy Masters. Stable post-operatively. Diet advanced to fulls per CCS Team. Foley can be removed-order placed. Strict intake and output. Abxs changed to rocephin  and flagyl  IV. Encouraged continued ambulation, IS  use. Appreciate nutritionist consult. AM labs ordered. Continue with current plan of care.      LOS: 6 days    Tonya Schneider 04/16/2023, 7:40 AM

## 2023-04-16 NOTE — Plan of Care (Signed)
   Problem: Education: Goal: Knowledge of General Education information will improve Description Including pain rating scale, medication(s)/side effects and non-pharmacologic comfort measures Outcome: Progressing   Problem: Health Behavior/Discharge Planning: Goal: Ability to manage health-related needs will improve Outcome: Progressing

## 2023-04-16 NOTE — Assessment & Plan Note (Signed)
 Secondary to mass effect, status post operative intervention Patient has yet to pass flatus or exhibit bowel movements Will defer decision on initiation of clear liquid diet and advancement of diet to general surgery

## 2023-04-16 NOTE — Progress Notes (Signed)
 PROGRESS NOTE   Tonya Schneider  FMW:[id number] DOB: 1955/12/05 DOA: 04/09/2023 PCP: Katina Pfeiffer, PA-C   Date of Service: the patient was seen and examined on 04/16/2023  Brief Narrative:  68 y.o. female without significant past medical history sent to the hospital for abdominal pain and colon obstruction, she related started about a month prior to admission with having crampy lower abdominal pain with intermittent diarrhea and constipation 3 days prior to admission she started having severe pain and ongoing watery diarrhea.  CT scan of the abdomen pelvis showed large fluid collection about 11 cm, vaginal ultrasound confirmed large cystic mass of the left adnexal region measuring about 11 cm.   Gynecology oncology and general surgery were both consulted.  Dr. Eldonna with gynecology performed total abdominal hysterectomy with bilateral salpingo-oophorectomy and drainage of pelvic abscess with drain placement on 2/4.   Assessment & Plan Pelvic abscess in female Patient is status post operative intervention with TAH/BSO and drainage of pelvic abscess on 2/4 by Dr. Eldonna as noted above Continue intravenous Zosyn , gynecology oncology recommending at least 48 hours more of IV antibiotics before considering transition to oral Intraoperative cultures revealing E. coli resistant to ampicillin with intermediate resistance to ampicillin/sulbactam.  Case discussed with infectious disease pharmacist who is recommending ceftriaxone  and Flagyl  IV with eventual transition to cefadroxil and oral Flagyl  for approximately 3 weeks from date of drainage. Per surgery, initiating clear liquid diet today.  Advance diet as tolerated. As needed opiate-based analgesics for substantial associated pain  S/P TAH-BSO (total abdominal hysterectomy and bilateral salpingo-oophorectomy) Biopsies obtained revealed no evidence of malignancy.   Gynecology oncology and general surgery both following Remainder of  assessment and plan as above Colonic obstruction (HCC) Secondary to mass effect, status post operative intervention Patient has yet to pass flatus or exhibit bowel movements Will defer decision on initiation of clear liquid diet and advancement of diet to general surgery Hypokalemia Replaced   Subjective:  Patient complaining of mild to severe abdominal pain, sharp in quality, intermittent, occurring in haphazard fashion.  However, frequency and duration of each brief episode have improved since yesterday. Physical Exam:  Vitals:   04/15/23 0615 04/15/23 1231 04/15/23 2150 04/16/23 0635  BP: 128/88 136/87 139/89 (!) 127/90  Pulse: 92 96 (!) 103 97  Resp: 19 18 19 19   Temp: (!) 97.4 F (36.3 C) 98.1 F (36.7 C) 97.9 F (36.6 C) 98.8 F (37.1 C)  TempSrc: Oral Oral Oral Oral  SpO2: 95% 97% 97% 95%  Weight:      Height:        Constitutional: Awake alert and oriented x3, no associated distress.   Skin: Anterior abdominal wall incision clean dry and intact dressing. Eyes: Pupils are equally reactive to light.  No evidence of scleral icterus or conjunctival pallor.  ENMT: Moist mucous membranes noted.  Posterior pharynx clear of any exudate or lesions.   Respiratory: clear to auscultation bilaterally, no wheezing, no crackles. Normal respiratory effort. No accessory muscle use.  Cardiovascular: Regular rate and rhythm, no murmurs / rubs / gallops. No extremity edema. 2+ pedal pulses. No carotid bruits.  Abdomen: Diffuse abdominal tenderness that is less so than yesterday.  Abdomen is soft with hypoactive bowel sounds. Musculoskeletal: No joint deformity upper and lower extremities. Good ROM, no contractures. Normal muscle tone.    Data Reviewed:  I have personally reviewed and interpreted labs, imaging.  Significant findings are   CBC: Recent Labs  Lab 04/09/23 1642 04/11/23 0528 04/14/23 9488  04/15/23 0457  WBC 16.1* 9.8 9.4 18.9*  HGB 12.6 11.1* 12.4 13.2  HCT 37.7  34.7* 39.5 41.7  MCV 85.7 91.3 89.4 89.7  PLT 284 252 353 445*   Basic Metabolic Panel: Recent Labs  Lab 04/11/23 0528 04/13/23 0904 04/14/23 0511 04/15/23 0457 04/16/23 0531  NA 141 143 143 144 139  K 3.4* 3.1* 3.3* 4.0 3.7  CL 108 107 106 104 99  CO2 23 24 22 25 26   GLUCOSE 88 97 98 138* 127*  BUN 21 16 13 16 22   CREATININE 0.68 0.49 0.60 0.63 0.83  CALCIUM 8.4* 8.4* 8.3* 8.4* 8.5*   GFR: Estimated Creatinine Clearance: 61.4 mL/min (by C-G formula based on SCr of 0.83 mg/dL). Liver Function Tests: Recent Labs  Lab 04/09/23 1642 04/15/23 1353  AST 11* 14*  ALT 13 15  ALKPHOS 74 65  BILITOT 0.8 1.0  PROT 7.1 6.4*  ALBUMIN  3.7 3.1*    Code Status:  Full code.  Code status decision has been confirmed with: patient Family Communication: Spouse is at bedside who has been updated on plan of care.   Severity of Illness:  The appropriate patient status for this patient is INPATIENT. Inpatient status is judged to be reasonable and necessary in order to provide the required intensity of service to ensure the patient's safety. The patient's presenting symptoms, physical exam findings, and initial radiographic and laboratory data in the context of their chronic comorbidities is felt to place them at high risk for further clinical deterioration. Furthermore, it is not anticipated that the patient will be medically stable for discharge from the hospital within 2 midnights of admission.   * I certify that at the point of admission it is my clinical judgment that the patient will require inpatient hospital care spanning beyond 2 midnights from the point of admission due to high intensity of service, high risk for further deterioration and high frequency of surveillance required.*  Time spent:  45 minutes  Author:  Zachary JINNY Ba MD  04/16/2023 9:30 AM

## 2023-04-16 NOTE — Progress Notes (Signed)
 Progress Note  2 Days Post-Op  Subjective: Sipped on some clears without nausea and now passing flatus and small BM. Ambulating. Abdominal pain overall stable  Objective: Vital signs in last 24 hours: Temp:  [97.9 F (36.6 C)-98.8 F (37.1 C)] 98.8 F (37.1 C) (02/06 0635) Pulse Rate:  [96-103] 97 (02/06 0635) Resp:  [18-19] 19 (02/06 0635) BP: (127-139)/(87-90) 127/90 (02/06 0635) SpO2:  [95 %-97 %] 95 % (02/06 0635) Last BM Date : 04/12/23  Intake/Output from previous day: 02/05 0701 - 02/06 0700 In: 290 [P.O.:240; IV Piggyback:50] Out: 965 [Urine:675; Drains:290] Intake/Output this shift: No intake/output data recorded.  PE: General: pleasant, WD, female who is sitting up in NAD Lungs: Respiratory effort nonlabored Abd: soft, mild TTP over incision which is cdi. Mild to moderate distension. JP sanguinous serous MSK: all 4 extremities are symmetrical with no cyanosis, clubbing, or edema. Skin: warm and dry Psych: A&Ox3 with an appropriate affect.    Lab Results:  Recent Labs    04/14/23 0511 04/15/23 0457  WBC 9.4 18.9*  HGB 12.4 13.2  HCT 39.5 41.7  PLT 353 445*   BMET Recent Labs    04/15/23 0457 04/16/23 0531  NA 144 139  K 4.0 3.7  CL 104 99  CO2 25 26  GLUCOSE 138* 127*  BUN 16 22  CREATININE 0.63 0.83  CALCIUM 8.4* 8.5*   PT/INR No results for input(s): LABPROT, INR in the last 72 hours. CMP     Component Value Date/Time   NA 139 04/16/2023 0531   K 3.7 04/16/2023 0531   CL 99 04/16/2023 0531   CO2 26 04/16/2023 0531   GLUCOSE 127 (H) 04/16/2023 0531   BUN 22 04/16/2023 0531   CREATININE 0.83 04/16/2023 0531   CALCIUM 8.5 (L) 04/16/2023 0531   PROT 6.4 (L) 04/15/2023 1353   ALBUMIN  3.1 (L) 04/15/2023 1353   AST 14 (L) 04/15/2023 1353   ALT 15 04/15/2023 1353   ALKPHOS 65 04/15/2023 1353   BILITOT 1.0 04/15/2023 1353   GFRNONAA >60 04/16/2023 0531   Lipase     Component Value Date/Time   LIPASE <10 (L) 04/09/2023 1642        Studies/Results: No results found.  Anti-infectives: Anti-infectives (From admission, onward)    Start     Dose/Rate Route Frequency Ordered Stop   04/14/23 2300  piperacillin -tazobactam (ZOSYN ) IVPB 3.375 g        3.375 g 12.5 mL/hr over 240 Minutes Intravenous Every 8 hours 04/14/23 1840     04/14/23 1500  piperacillin -tazobactam (ZOSYN ) IVPB 3.375 g  Status:  Discontinued        3.375 g 12.5 mL/hr over 240 Minutes Intravenous  Once 04/14/23 1450 04/14/23 1838   04/14/23 1443  piperacillin -tazobactam (ZOSYN ) 3.375 GM/50ML IVPB       Note to Pharmacy: Adrien Cousin: cabinet override      04/14/23 1443 04/15/23 0259   04/14/23 0600  ceFAZolin  (ANCEF ) IVPB 2g/100 mL premix        2 g 200 mL/hr over 30 Minutes Intravenous On call to O.R. 04/13/23 1701 04/14/23 1309   04/14/23 0600  metroNIDAZOLE  (FLAGYL ) IVPB 500 mg        500 mg 100 mL/hr over 60 Minutes Intravenous On call to O.R. 04/13/23 1701 04/14/23 1329        Assessment/Plan  Pelvic mass, partial large bowel obstruction   POD2 TAH b/l salpingo-oophorectomy, drainage of pelvic abscess cavity Dr. Eldonna 2/4  Drain SS. +BF,  advance to FLD this am and continue to ADAT Mobilizing.  AF, CBC pending. Cont abx  FEN: FLD, IVF per primary ID: rocephin /flagyl   VTE: lovenox  Foley: can be removed from gen sx standpoint  I reviewed Consultant gyn/onc notes, last 24 h vitals and pain scores, last 48 h intake and output, last 24 h labs and trends, and last 24 h imaging results.     LOS: 6 days   [place] VEAR Mais, [facility] 04/16/2023, 8:37 AM Please see Amion for pager number during day hours 7:00am-4:30pm

## 2023-04-16 NOTE — Assessment & Plan Note (Addendum)
 Patient is status post operative intervention with TAH/BSO and drainage of pelvic abscess on 2/4 by Dr. Eldonna as noted above Continue intravenous Zosyn , gynecology oncology recommending at least 48 hours more of IV antibiotics before considering transition to oral Intraoperative cultures revealing E. coli resistant to ampicillin with intermediate resistance to ampicillin/sulbactam.  Case discussed with infectious disease pharmacist who is recommending ceftriaxone  and Flagyl  IV with eventual transition to cefadroxil and oral Flagyl  for approximately 3 weeks from date of drainage. Per surgery, initiating clear liquid diet today.  Advance diet as tolerated. As needed opiate-based analgesics for substantial associated pain

## 2023-04-16 NOTE — Assessment & Plan Note (Signed)
 Replaced

## 2023-04-16 NOTE — Assessment & Plan Note (Addendum)
 Biopsies obtained revealed no evidence of malignancy.   Gynecology oncology and general surgery both following Remainder of assessment and plan as above

## 2023-04-16 NOTE — Progress Notes (Signed)
 Mobility Specialist - Progress Note   04/16/23 0857  Mobility  Activity Ambulated with assistance in hallway  Level of Assistance Standby assist, set-up cues, supervision of patient - no hands on  Assistive Device Front wheel walker  Distance Ambulated (ft) 500 ft  Activity Response Tolerated well  Mobility Referral Yes  Mobility visit 1 Mobility  Mobility Specialist Start Time (ACUTE ONLY) 0844  Mobility Specialist Stop Time (ACUTE ONLY) 0856  Mobility Specialist Time Calculation (min) (ACUTE ONLY) 12 min   Pt received in hallway with NT and agreeable to mobility. No complaints during session. Pt to recliner after session with all needs met.   [facility]

## 2023-04-16 NOTE — Plan of Care (Signed)
   Problem: Education: Goal: Knowledge of General Education information will improve Description Including pain rating scale, medication(s)/side effects and non-pharmacologic comfort measures Outcome: Progressing

## 2023-04-17 DIAGNOSIS — N739 Female pelvic inflammatory disease, unspecified: Secondary | ICD-10-CM | POA: Diagnosis not present

## 2023-04-17 DIAGNOSIS — Z90710 Acquired absence of both cervix and uterus: Secondary | ICD-10-CM | POA: Diagnosis not present

## 2023-04-17 DIAGNOSIS — E876 Hypokalemia: Secondary | ICD-10-CM | POA: Diagnosis not present

## 2023-04-17 DIAGNOSIS — K56609 Unspecified intestinal obstruction, unspecified as to partial versus complete obstruction: Secondary | ICD-10-CM | POA: Diagnosis not present

## 2023-04-17 LAB — MAGNESIUM: Magnesium: 2.1 mg/dL (ref 1.7–2.4)

## 2023-04-17 LAB — AEROBIC/ANAEROBIC CULTURE W GRAM STAIN (SURGICAL/DEEP WOUND)

## 2023-04-17 LAB — COMPREHENSIVE METABOLIC PANEL
ALT: 17 U/L (ref 0–44)
AST: 20 U/L (ref 15–41)
Albumin: 2.5 g/dL — ABNORMAL LOW (ref 3.5–5.0)
Alkaline Phosphatase: 56 U/L (ref 38–126)
Anion gap: 9 (ref 5–15)
BUN: 18 mg/dL (ref 8–23)
CO2: 26 mmol/L (ref 22–32)
Calcium: 7.9 mg/dL — ABNORMAL LOW (ref 8.9–10.3)
Chloride: 104 mmol/L (ref 98–111)
Creatinine, Ser: 0.61 mg/dL (ref 0.44–1.00)
GFR, Estimated: >60 mL/min (ref >60)
Glucose, Bld: 112 mg/dL — ABNORMAL HIGH (ref 70–99)
Potassium: 3.4 mmol/L — ABNORMAL LOW (ref 3.5–5.1)
Sodium: 139 mmol/L (ref 135–145)
Total Bilirubin: 0.6 mg/dL (ref 0.0–1.2)
Total Protein: 5.2 g/dL — ABNORMAL LOW (ref 6.5–8.1)

## 2023-04-17 LAB — CBC WITH DIFFERENTIAL/PLATELET
Abs Immature Granulocytes: 0.41 10*3/uL — ABNORMAL HIGH (ref 0.00–0.07)
Basophils Absolute: 0.1 10*3/uL (ref 0.0–0.1)
Basophils Relative: 1 %
Eosinophils Absolute: 0.2 10*3/uL (ref 0.0–0.5)
Eosinophils Relative: 1 %
HCT: 36.6 % (ref 36.0–46.0)
Hemoglobin: 11.8 g/dL — ABNORMAL LOW (ref 12.0–15.0)
Immature Granulocytes: 4 %
Lymphocytes Relative: 7 %
Lymphs Abs: 0.9 10*3/uL (ref 0.7–4.0)
MCH: 28.6 pg (ref 26.0–34.0)
MCHC: 32.2 g/dL (ref 30.0–36.0)
MCV: 88.8 fL (ref 80.0–100.0)
Monocytes Absolute: 0.6 10*3/uL (ref 0.1–1.0)
Monocytes Relative: 5 %
Neutro Abs: 9.7 10*3/uL — ABNORMAL HIGH (ref 1.7–7.7)
Neutrophils Relative %: 82 %
Platelets: 373 10*3/uL (ref 150–400)
RBC: 4.12 MIL/uL (ref 3.87–5.11)
RDW: 13.6 % (ref 11.5–15.5)
WBC: 11.8 10*3/uL — ABNORMAL HIGH (ref 4.0–10.5)
nRBC: 0 % (ref 0.0–0.2)

## 2023-04-17 MED ORDER — POTASSIUM CHLORIDE CRYS ER 20 MEQ PO TBCR
40.0000 meq | EXTENDED_RELEASE_TABLET | Freq: Once | ORAL | Status: AC
  Administered 2023-04-17: 40 meq via ORAL
  Filled 2023-04-17: qty 2

## 2023-04-17 MED ORDER — MAGNESIUM HYDROXIDE 400 MG/5ML PO SUSP
15.0000 mL | Freq: Every day | ORAL | Status: DC | PRN
  Administered 2023-04-17: 15 mL via ORAL
  Filled 2023-04-17: qty 30

## 2023-04-17 MED ORDER — SENNA 8.6 MG PO TABS
2.0000 | ORAL_TABLET | Freq: Every day | ORAL | Status: DC
  Administered 2023-04-17 – 2023-04-21 (×5): 17.2 mg via ORAL
  Filled 2023-04-17 (×5): qty 2

## 2023-04-17 MED ORDER — BISACODYL 10 MG RE SUPP
10.0000 mg | Freq: Once | RECTAL | Status: AC
Start: 2023-04-17 — End: 2023-04-17
  Administered 2023-04-17: 10 mg via RECTAL
  Filled 2023-04-17: qty 1

## 2023-04-17 MED ORDER — BISACODYL 10 MG RE SUPP
10.0000 mg | Freq: Once | RECTAL | Status: AC
  Administered 2023-04-18: 10 mg via RECTAL
  Filled 2023-04-17: qty 1

## 2023-04-17 NOTE — Assessment & Plan Note (Addendum)
 Recurrent, replacing with additional doses of oral potassium

## 2023-04-17 NOTE — Assessment & Plan Note (Signed)
 Secondary to mass effect, status post operative intervention Patient has yet to pass flatus or exhibit bowel movements Will defer decision on initiation of clear liquid diet and advancement of diet to general surgery

## 2023-04-17 NOTE — Progress Notes (Signed)
 Inpatient Gyn Onc Progress Note  Subjective: Pt reports doing well. No nausea or emesis. Ate potato soup yesterday and tolerated this. Also had a portion of a supplemental shake. Was unable to finish miralax  yesterday due to the volume of liquid to drink being too much. Ambulating in the halls. Continues to pass flatus, no bowel movement. Hiccupping intermittent. Abdominal pain is sharp at times but manageable. Voiding, urine still concentrated. No chest pain, dyspnea. No needs voiced.   Objective: Vital signs in last 24 hours: Temp:  [97.9 F (36.6 C)-98.3 F (36.8 C)] 98.1 F (36.7 C) (02/07 0551) Pulse Rate:  [95-102] 95 (02/07 0551) Resp:  [18] 18 (02/07 0551) BP: (129-130)/(71-87) 129/87 (02/07 0551) SpO2:  [97 %-98 %] 97 % (02/07 0551) Last BM Date : 04/16/23  Intake/Output from previous day: 02/06 0701 - 02/07 0700 In: 1130 [P.O.:930; IV Piggyback:200] Out: 900 [Urine:850; Drains:50]  Physical Examination: General: Alert, oriented, no acute distress. HEENT: Normocephalic, atraumatic. Sclera anicteric.  Chest: Normal work of breathing. Lungs clear.  Cardiovascular: Regular rate and rhythm. Abdomen: Distended, rounded, tympanitic, soft. Mild TTP. Active bowel sounds. Abdominal midline incision with op site dressing intact with no drainage noted underneath. Surgical drain with serosanguinous output Extremities: Warm, well perfused.  No edema bilaterally.  Skin: No rashes or lesions noted.  Labs: WBC/Hgb/Hct/Plts:  11.8/11.8/36.6/373 (02/07 9490) BUN/Cr/glu/ALT/AST/amyl/lip:  18/0.61/--/17/20/--/-- (02/07 0509) CEA: 14.8 (elevated) CA125: 24.6 (normal) Abundant E coli on abscess wall taken intra-op Path has returned from procedure on 04/14/23: A.   UTERUS, CERVIX, BILATERAL FALLOPIAN TUBES AND OVARIES: Cervix     Nabothian cyst     Negative for dysplasia Endometrium     Atrophic     Benign endometrial polyps     Negative for endometrial intraepithelial neoplasia (EIN) and  malignancy Myometrium     Unremarkable     Negative for malignancy Right ovary     Benign mucinous cyst, 3 mm     Negative for endometriosis and malignancy Left ovary     Mild inflammation with reactive serosal mesothelial changes and mild serositis     Negative for endometriosis and malignancy Right fallopian tube     Unremarkable     Negative for endometriosis and malignancy Left fallopian tube     Focal mild nonspecific inflammation     Negative for endometriosis and malignancy  B.   PERINEUM CUL DE SAC, POSTERIOR, EXCISION: Granulation tissue with abundant fibrin and hemorrhage Negative for endometriosis and malignancy   Assessment/Plan: 68 y.o. s/p total abdominal hysterectomy, bilateral salpingo-oophorectomy, drainage of pelvic abscess cavity, drain placement on 04/14/23 with Dr. Hoy Masters. Stable post-operatively. Currently on full liquid diet per CCS Team. Continue intake and output. Abxs changed to rocephin  and flagyl  IV on 04/16/2023. Can consider transition to oral based on sensitivities per Dr. Lewie note from 04/16/23. Continue with miralax . Milk of magnesia will be ordered as well. Dulcolax suppository per Dr. Viktoria. AM labs ordered. Continue with current plan of care. Given prolong period NPO, would consider initiating TPN if unable to advance and tolerate diet in next 24-48 hours.      LOS: 7 days    Tonya Schneider 04/17/2023, 10:00 AM

## 2023-04-17 NOTE — Assessment & Plan Note (Addendum)
 Patient is status post operative intervention with TAH/BSO and drainage of pelvic abscess on 2/4 by Dr. Eldonna as noted above Continue intravenous Zosyn , per my discussion with gynecology oncology will transition to oral antibiotics on 2/8  Intraoperative cultures revealing E. coli resistant to ampicillin with intermediate resistance to ampicillin/sulbactam.  Case discussed with infectious disease pharmacist who is recommending eventual transition to cefadroxil and oral Flagyl  for approximately 3 weeks from date of drainage. Now tolerating full liquid diet although patient is still experiencing abdominal pain. As needed opiate-based analgesics for substantial associated pain

## 2023-04-17 NOTE — Plan of Care (Signed)
  Problem: Education: Goal: Knowledge of General Education information will improve Description: Including pain rating scale, medication(s)/side effects and non-pharmacologic comfort measures Outcome: Progressing   Problem: Health Behavior/Discharge Planning: Goal: Ability to manage health-related needs will improve Outcome: Progressing   Problem: Clinical Measurements: Goal: Will remain free from infection Outcome: Progressing Goal: Respiratory complications will improve Outcome: Progressing Goal: Cardiovascular complication will be avoided Outcome: Progressing   Problem: Activity: Goal: Risk for activity intolerance will decrease Outcome: Progressing   Problem: Safety: Goal: Ability to remain free from injury will improve Outcome: Progressing   Problem: Skin Integrity: Goal: Risk for impaired skin integrity will decrease Outcome: Progressing   Problem: Skin Integrity: Goal: Demonstration of wound healing without infection will improve Outcome: Progressing   Problem: Nutrition: Goal: Adequate nutrition will be maintained Outcome: Not Progressing   Problem: Coping: Goal: Level of anxiety will decrease Outcome: Not Progressing   Problem: Pain Managment: Goal: General experience of comfort will improve and/or be controlled Outcome: Not Progressing

## 2023-04-17 NOTE — Progress Notes (Signed)
 PROGRESS NOTE   Tonya Schneider  FMW:[id number] DOB: [birth year] DOA: 04/09/2023 PCP: Katina Pfeiffer, PA-C   Date of Service: the patient was seen and examined on 04/17/2023  Brief Narrative:  68 y.o. female without significant past medical history sent to the hospital for abdominal pain and colon obstruction, she related started about a month prior to admission with having crampy lower abdominal pain with intermittent diarrhea and constipation 3 days prior to admission she started having severe pain and ongoing watery diarrhea.  CT scan of the abdomen pelvis showed large fluid collection about 11 cm, vaginal ultrasound confirmed large cystic mass of the left adnexal region measuring about 11 cm.   Gynecology oncology and general surgery were both consulted.  Dr. Eldonna with gynecology performed total abdominal hysterectomy with bilateral salpingo-oophorectomy and drainage of pelvic abscess with drain placement on 2/4.   Assessment & Plan Pelvic abscess in female Patient is status post operative intervention with TAH/BSO and drainage of pelvic abscess on 2/4 by Dr. Eldonna as noted above Continue intravenous Zosyn , per my discussion with gynecology oncology will transition to oral antibiotics on 2/8  Intraoperative cultures revealing E. coli resistant to ampicillin with intermediate resistance to ampicillin/sulbactam.  Case discussed with infectious disease pharmacist who is recommending eventual transition to cefadroxil and oral Flagyl  for approximately 3 weeks from date of drainage. Now tolerating full liquid diet although patient is still experiencing abdominal pain. As needed opiate-based analgesics for substantial associated pain  S/P TAH-BSO (total abdominal hysterectomy and bilateral salpingo-oophorectomy) Biopsies obtained revealed no evidence of malignancy.   Gynecology oncology and general surgery both following Remainder of assessment and plan as above Colonic obstruction  (HCC) Secondary to mass effect, status post operative intervention Patient has yet to pass flatus or exhibit bowel movements Will defer decision on initiation of clear liquid diet and advancement of diet to general surgery Hypokalemia Recurrent, replacing with additional doses of oral potassium   Subjective:  Patient complaining of worsening abdominal pain today, cramping, severe in intensity, associated with inability to move her bowels.    Physical Exam:  Vitals:   04/16/23 0635 04/16/23 1338 04/16/23 2039 04/17/23 0551  BP: (!) 127/90 129/71 130/85 129/87  Pulse: 97 (!) 102 100 95  Resp: 19 18 18 18   Temp: 98.8 F (37.1 C) 97.9 F (36.6 C) 98.3 F (36.8 C) 98.1 F (36.7 C)  TempSrc: Oral Oral Oral Oral  SpO2: 95% 98% 97% 97%  Weight:      Height:        Constitutional: Awake alert and oriented x3, patient is in mild distress due to abdominal pain. Skin: Anterior abdominal wall incision clean dry and intact dressing. Eyes: Pupils are equally reactive to light.  No evidence of scleral icterus or conjunctival pallor.  ENMT: Moist mucous membranes noted.  Posterior pharynx clear of any exudate or lesions.   Respiratory: clear to auscultation bilaterally, no wheezing, no crackles. Normal respiratory effort. No accessory muscle use.  Cardiovascular: Regular rate and rhythm, no murmurs / rubs / gallops. No extremity edema. 2+ pedal pulses. No carotid bruits.  Abdomen: Abdominal tenderness primarily in the lower abdomen.  Notable fullness in the lower abdomen although abdomen is soft.  Notable hyperactive bowel sounds. Musculoskeletal: No joint deformity upper and lower extremities. Good ROM, no contractures. Normal muscle tone.    Data Reviewed:  I have personally reviewed and interpreted labs, imaging.  Significant findings are   CBC: Recent Labs  Lab 04/11/23 0528 04/14/23 0511 04/15/23  0457 04/17/23 0509  WBC 9.8 9.4 18.9* 11.8*  NEUTROABS  --   --   --  9.7*  HGB  11.1* 12.4 13.2 11.8*  HCT 34.7* 39.5 41.7 36.6  MCV 91.3 89.4 89.7 88.8  PLT 252 353 445* 373   Basic Metabolic Panel: Recent Labs  Lab 04/13/23 0904 04/14/23 0511 04/15/23 0457 04/16/23 0531 04/17/23 0509  NA 143 143 144 139 139  K 3.1* 3.3* 4.0 3.7 3.4*  CL 107 106 104 99 104  CO2 24 22 25 26 26   GLUCOSE 97 98 138* 127* 112*  BUN 16 13 16 22 18   CREATININE 0.49 0.60 0.63 0.83 0.61  CALCIUM 8.4* 8.3* 8.4* 8.5* 7.9*  MG  --   --   --   --  2.1   GFR: Estimated Creatinine Clearance: 63.7 mL/min (by C-G formula based on SCr of 0.61 mg/dL). Liver Function Tests: Recent Labs  Lab 04/15/23 1353 04/17/23 0509  AST 14* 20  ALT 15 17  ALKPHOS 65 56  BILITOT 1.0 0.6  PROT 6.4* 5.2*  ALBUMIN  3.1* 2.5*    Code Status:  Full code.  Code status decision has been confirmed with: patient Family Communication: Spouse is at bedside who has been updated on plan of care.   Severity of Illness:  The appropriate patient status for this patient is INPATIENT. Inpatient status is judged to be reasonable and necessary in order to provide the required intensity of service to ensure the patient's safety. The patient's presenting symptoms, physical exam findings, and initial radiographic and laboratory data in the context of their chronic comorbidities is felt to place them at high risk for further clinical deterioration. Furthermore, it is not anticipated that the patient will be medically stable for discharge from the hospital within 2 midnights of admission.   * I certify that at the point of admission it is my clinical judgment that the patient will require inpatient hospital care spanning beyond 2 midnights from the point of admission due to high intensity of service, high risk for further deterioration and high frequency of surveillance required.*  Time spent:  47 minutes  Author:  Zachary JINNY Ba MD  04/17/2023 9:02 AM

## 2023-04-17 NOTE — Progress Notes (Signed)
 Progress Note  3 Days Post-Op  Subjective: Overall tolerating FLD without nausea but still having intermittent stabbing abdominal pain with PO intake which was present prior to surgery. Still distended but passing flatus and small amount of stool. mobilizing  Objective: Vital signs in last 24 hours: Temp:  [98.1 F (36.7 C)-98.3 F (36.8 C)] 98.1 F (36.7 C) (02/07 0551) Pulse Rate:  [95-100] 95 (02/07 0551) Resp:  [18] 18 (02/07 0551) BP: (129-130)/(85-87) 129/87 (02/07 0551) SpO2:  [97 %] 97 % (02/07 0551) Last BM Date : 04/16/23  Intake/Output from previous day: 02/06 0701 - 02/07 0700 In: 1130 [P.O.:930; IV Piggyback:200] Out: 900 [Urine:850; Drains:50] Intake/Output this shift: Total I/O In: 480 [P.O.:480] Out: 0   PE: General: pleasant, WD, female who is sitting up in NAD Lungs: Respiratory effort nonlabored Abd: soft, mild TTP over incision which is cdi. Mild to moderate distension. JP ss MSK: all 4 extremities are symmetrical with no cyanosis, clubbing, or edema. Skin: warm and dry Psych: A&Ox3 with an appropriate affect.    Lab Results:  Recent Labs    04/15/23 0457 04/17/23 0509  WBC 18.9* 11.8*  HGB 13.2 11.8*  HCT 41.7 36.6  PLT 445* 373   BMET Recent Labs    04/16/23 0531 04/17/23 0509  NA 139 139  K 3.7 3.4*  CL 99 104  CO2 26 26  GLUCOSE 127* 112*  BUN 22 18  CREATININE 0.83 0.61  CALCIUM 8.5* 7.9*   PT/INR No results for input(s): LABPROT, INR in the last 72 hours. CMP     Component Value Date/Time   NA 139 04/17/2023 0509   K 3.4 (L) 04/17/2023 0509   CL 104 04/17/2023 0509   CO2 26 04/17/2023 0509   GLUCOSE 112 (H) 04/17/2023 0509   BUN 18 04/17/2023 0509   CREATININE 0.61 04/17/2023 0509   CALCIUM 7.9 (L) 04/17/2023 0509   PROT 5.2 (L) 04/17/2023 0509   ALBUMIN  2.5 (L) 04/17/2023 0509   AST 20 04/17/2023 0509   ALT 17 04/17/2023 0509   ALKPHOS 56 04/17/2023 0509   BILITOT 0.6 04/17/2023 0509   GFRNONAA >60  04/17/2023 0509   Lipase     Component Value Date/Time   LIPASE <10 (L) 04/09/2023 1642       Studies/Results: No results found.  Anti-infectives: Anti-infectives (From admission, onward)    Start     Dose/Rate Route Frequency Ordered Stop   04/16/23 1400  cefTRIAXone  (ROCEPHIN ) 2 g in sodium chloride  0.9 % 100 mL IVPB        2 g 200 mL/hr over 30 Minutes Intravenous Every 24 hours 04/16/23 1012     04/16/23 1100  metroNIDAZOLE  (FLAGYL ) IVPB 500 mg        500 mg 100 mL/hr over 60 Minutes Intravenous Every 12 hours 04/16/23 1012     04/14/23 2300  piperacillin -tazobactam (ZOSYN ) IVPB 3.375 g  Status:  Discontinued        3.375 g 12.5 mL/hr over 240 Minutes Intravenous Every 8 hours 04/14/23 1840 04/16/23 1012   04/14/23 1500  piperacillin -tazobactam (ZOSYN ) IVPB 3.375 g  Status:  Discontinued        3.375 g 12.5 mL/hr over 240 Minutes Intravenous  Once 04/14/23 1450 04/14/23 1838   04/14/23 1443  piperacillin -tazobactam (ZOSYN ) 3.375 GM/50ML IVPB       Note to Pharmacy: Adrien Cousin: cabinet override      04/14/23 1443 04/15/23 0259   04/14/23 0600  ceFAZolin  (ANCEF ) IVPB 2g/100  mL premix        2 g 200 mL/hr over 30 Minutes Intravenous On call to O.R. 04/13/23 1701 04/14/23 1309   04/14/23 0600  metroNIDAZOLE  (FLAGYL ) IVPB 500 mg        500 mg 100 mL/hr over 60 Minutes Intravenous On call to O.R. 04/13/23 1701 04/14/23 1329        Assessment/Plan  Pelvic abscess, partial large bowel obstruction   POD3 TAH b/l salpingo-oophorectomy, drainage of pelvic abscess cavity Dr. Eldonna 2/4  Drain SS. +BF, continue FLD for now given ongoing abdominal pain. Agree with suppository per gyn/onc. Plan to repeat tomorrow. Could consider enema tomorrow - she has a lot of colonic stool burden on pre op CT Mobilizing.  AF, WBC 11.8. Cont abx  FEN: FLD, IVF per primary ID: rocephin /flagyl  VTE: lovenox  Foley: can be removed from gen sx standpoint  I reviewed Consultant gyn/onc  notes, last 24 h vitals and pain scores, last 48 h intake and output, last 24 h labs and trends, and last 24 h imaging results.     LOS: 7 days   [place] VEAR Mais, [facility] Surgery 04/17/2023, 1:57 PM Please see Amion for pager number during day hours 7:00am-4:30pm

## 2023-04-17 NOTE — Assessment & Plan Note (Signed)
 Biopsies obtained revealed no evidence of malignancy.   Gynecology oncology and general surgery both following Remainder of assessment and plan as above

## 2023-04-18 DIAGNOSIS — Z90710 Acquired absence of both cervix and uterus: Secondary | ICD-10-CM | POA: Diagnosis not present

## 2023-04-18 DIAGNOSIS — K56609 Unspecified intestinal obstruction, unspecified as to partial versus complete obstruction: Secondary | ICD-10-CM | POA: Diagnosis not present

## 2023-04-18 DIAGNOSIS — E876 Hypokalemia: Secondary | ICD-10-CM | POA: Diagnosis not present

## 2023-04-18 DIAGNOSIS — N739 Female pelvic inflammatory disease, unspecified: Secondary | ICD-10-CM | POA: Diagnosis not present

## 2023-04-18 LAB — CBC WITH DIFFERENTIAL/PLATELET
Abs Immature Granulocytes: 0.31 10*3/uL — ABNORMAL HIGH (ref 0.00–0.07)
Basophils Absolute: 0.1 10*3/uL (ref 0.0–0.1)
Basophils Relative: 1 %
Eosinophils Absolute: 0.2 10*3/uL (ref 0.0–0.5)
Eosinophils Relative: 2 %
HCT: 38.5 % (ref 36.0–46.0)
Hemoglobin: 12.4 g/dL (ref 12.0–15.0)
Immature Granulocytes: 3 %
Lymphocytes Relative: 9 %
Lymphs Abs: 0.8 10*3/uL (ref 0.7–4.0)
MCH: 28.6 pg (ref 26.0–34.0)
MCHC: 32.2 g/dL (ref 30.0–36.0)
MCV: 88.9 fL (ref 80.0–100.0)
Monocytes Absolute: 0.7 10*3/uL (ref 0.1–1.0)
Monocytes Relative: 7 %
Neutro Abs: 7.7 10*3/uL (ref 1.7–7.7)
Neutrophils Relative %: 78 %
Platelets: 383 10*3/uL (ref 150–400)
RBC: 4.33 MIL/uL (ref 3.87–5.11)
RDW: 13.5 % (ref 11.5–15.5)
WBC: 9.8 10*3/uL (ref 4.0–10.5)
nRBC: 0 % (ref 0.0–0.2)

## 2023-04-18 LAB — BASIC METABOLIC PANEL
Anion gap: 9 (ref 5–15)
BUN: 13 mg/dL (ref 8–23)
CO2: 27 mmol/L (ref 22–32)
Calcium: 8.3 mg/dL — ABNORMAL LOW (ref 8.9–10.3)
Chloride: 103 mmol/L (ref 98–111)
Creatinine, Ser: 0.56 mg/dL (ref 0.44–1.00)
GFR, Estimated: >60 mL/min (ref >60)
Glucose, Bld: 118 mg/dL — ABNORMAL HIGH (ref 70–99)
Potassium: 4 mmol/L (ref 3.5–5.1)
Sodium: 139 mmol/L (ref 135–145)

## 2023-04-18 LAB — MAGNESIUM: Magnesium: 2.2 mg/dL (ref 1.7–2.4)

## 2023-04-18 NOTE — Progress Notes (Signed)
 PROGRESS NOTE   Tonya Schneider  FMW:[id number] DOB: 1955-12-08 DOA: 04/09/2023 PCP: Katina Pfeiffer, PA-C   Date of Service: the patient was seen and examined on 04/18/2023  Brief Narrative:  68 y.o. female without significant past medical history sent to the hospital for abdominal pain and colon obstruction, she related started about a month prior to admission with having crampy lower abdominal pain with intermittent diarrhea and constipation 3 days prior to admission she started having severe pain and ongoing watery diarrhea.  CT scan of the abdomen pelvis showed large fluid collection about 11 cm, vaginal ultrasound confirmed large cystic mass of the left adnexal region measuring about 11 cm.   Gynecology oncology and general surgery were both consulted.  Dr. Eldonna with gynecology performed total abdominal hysterectomy with bilateral salpingo-oophorectomy and drainage of pelvic abscess with drain placement on 2/4.   Assessment & Plan Pelvic abscess in female Patient is status post operative intervention with TAH/BSO and drainage of pelvic abscess on 2/4 by Dr. Eldonna as noted above Leukocytosis normalized Continue intravenous Zosyn .  Once clinically improved will transition to oral antibiotics prior to discharge. Intraoperative cultures revealing E. coli resistant to ampicillin with intermediate resistance to ampicillin/sulbactam.  Currently receiving ceftriaxone  and Flagyl .  Case discussed with infectious disease pharmacist who is recommending eventual transition to cefadroxil and oral Flagyl  when discharged will complete course of antibiotics totaling 3 weeks from date of drainage.   Now tolerating full liquid diet  Abdominal pain improving Patient not moving bowels regularly despite multiple laxatives -per general surgery patient may require flexible sigmoidoscopy if bowel function does not completely return. As needed opiate-based analgesics for substantial associated pain  S/P  TAH-BSO (total abdominal hysterectomy and bilateral salpingo-oophorectomy) Biopsies obtained revealed no evidence of malignancy.   Gynecology oncology and general surgery both following Remainder of assessment and plan as above Colonic obstruction (HCC) Secondary to mass effect, status post operative intervention Patient passing flatus and has passed a small bowel movement in the past 24 hours however overall bowel movements have been extremely difficult despite laxatives. Will defer advancement of diet to general surgery Hypokalemia Recurrent, replacing with additional doses of oral potassium   Subjective:  Patient states abdominal pain has substantially improved today.  Patient reports having 1 small bowel movement since yesterday that is nonbloody with continued passing of flatus.  Patient reports she is experiencing good appetite.  Physical Exam:  Vitals:   04/17/23 0551 04/17/23 1421 04/17/23 2048 04/18/23 0522  BP: 129/87 138/88 (!) 143/88 (!) 132/90  Pulse: 95 93 96 92  Resp: 18 17 17 14   Temp: 98.1 F (36.7 C) 98.2 F (36.8 C) 98.6 F (37 C) 98.6 F (37 C)  TempSrc: Oral Oral Oral Oral  SpO2: 97% 99% 98% 98%  Weight:      Height:        Constitutional: Awake alert and oriented x3, patient is currently not in distress. Skin: Anterior abdominal wall incision clean dry and intact dressing. Eyes: Pupils are equally reactive to light.  No evidence of scleral icterus or conjunctival pallor.  ENMT: Moist mucous membranes noted.  Posterior pharynx clear of any exudate or lesions.   Respiratory: clear to auscultation bilaterally, no wheezing, no crackles. Normal respiratory effort. No accessory muscle use.  Cardiovascular: Regular rate and rhythm, no murmurs / rubs / gallops. No extremity edema. 2+ pedal pulses. No carotid bruits.  Abdomen: Slightly protuberant abdomen with lower abdominal tenderness.  Abdomen is soft.  Normoactive bowel sounds.. Musculoskeletal:  No joint  deformity upper and lower extremities. Good ROM, no contractures. Normal muscle tone.    Data Reviewed:  I have personally reviewed and interpreted labs, imaging.  Significant findings are   CBC: Recent Labs  Lab 04/14/23 0511 04/15/23 0457 04/17/23 0509 04/18/23 0532  WBC 9.4 18.9* 11.8* 9.8  NEUTROABS  --   --  9.7* 7.7  HGB 12.4 13.2 11.8* 12.4  HCT 39.5 41.7 36.6 38.5  MCV 89.4 89.7 88.8 88.9  PLT 353 445* 373 383   Basic Metabolic Panel: Recent Labs  Lab 04/14/23 0511 04/15/23 0457 04/16/23 0531 04/17/23 0509 04/18/23 0532  NA 143 144 139 139 139  K 3.3* 4.0 3.7 3.4* 4.0  CL 106 104 99 104 103  CO2 22 25 26 26 27   GLUCOSE 98 138* 127* 112* 118*  BUN 13 16 22 18 13   CREATININE 0.60 0.63 0.83 0.61 0.56  CALCIUM 8.3* 8.4* 8.5* 7.9* 8.3*  MG  --   --   --  2.1 2.2   GFR: Estimated Creatinine Clearance: 63.7 mL/min (by C-G formula based on SCr of 0.56 mg/dL). Liver Function Tests: Recent Labs  Lab 04/15/23 1353 04/17/23 0509  AST 14* 20  ALT 15 17  ALKPHOS 65 56  BILITOT 1.0 0.6  PROT 6.4* 5.2*  ALBUMIN  3.1* 2.5*    Code Status:  Full code.  Code status decision has been confirmed with: patient Family Communication: Spouse is at bedside who has been updated on plan of care.   Severity of Illness:  The appropriate patient status for this patient is INPATIENT. Inpatient status is judged to be reasonable and necessary in order to provide the required intensity of service to ensure the patient's safety. The patient's presenting symptoms, physical exam findings, and initial radiographic and laboratory data in the context of their chronic comorbidities is felt to place them at high risk for further clinical deterioration. Furthermore, it is not anticipated that the patient will be medically stable for discharge from the hospital within 2 midnights of admission.   * I certify that at the point of admission it is my clinical judgment that the patient will  require inpatient hospital care spanning beyond 2 midnights from the point of admission due to high intensity of service, high risk for further deterioration and high frequency of surveillance required.*  Time spent:  45 minutes  Author:  Zachary JINNY Ba MD  04/18/2023 8:31 AM

## 2023-04-18 NOTE — Assessment & Plan Note (Signed)
 Biopsies obtained revealed no evidence of malignancy.   Gynecology oncology and general surgery both following Remainder of assessment and plan as above

## 2023-04-18 NOTE — Assessment & Plan Note (Addendum)
 Patient is status post operative intervention with TAH/BSO and drainage of pelvic abscess on 2/4 by Dr. Eldonna as noted above Leukocytosis normalized Continue intravenous Zosyn .  Once clinically improved will transition to oral antibiotics prior to discharge. Intraoperative cultures revealing E. coli resistant to ampicillin with intermediate resistance to ampicillin/sulbactam.  Currently receiving ceftriaxone  and Flagyl .  Case discussed with infectious disease pharmacist who is recommending eventual transition to cefadroxil and oral Flagyl  when discharged will complete course of antibiotics totaling 3 weeks from date of drainage.   Now tolerating full liquid diet  Abdominal pain improving Patient not moving bowels regularly despite multiple laxatives -per general surgery patient may require flexible sigmoidoscopy if bowel function does not completely return. As needed opiate-based analgesics for substantial associated pain

## 2023-04-18 NOTE — Progress Notes (Signed)
 Progress Note  4 Days Post-Op  Subjective: Has been on liquids. Began passing flatus yesterday. Mobilizing.  Objective: Vital signs in last 24 hours: Temp:  [98.2 F (36.8 C)-98.6 F (37 C)] 98.6 F (37 C) (02/08 0522) Pulse Rate:  [92-96] 92 (02/08 0522) Resp:  [14-17] 14 (02/08 0522) BP: (132-143)/(88-90) 132/90 (02/08 0522) SpO2:  [98 %-99 %] 98 % (02/08 0522) Last BM Date : 04/16/23  Intake/Output from previous day: 02/07 0701 - 02/08 0700 In: 1210 [P.O.:1210] Out: 487 [Urine:400; Drains:87] Intake/Output this shift: Total I/O In: 240 [P.O.:240] Out: -   PE: General: pleasant, WD, female who is sitting up in NAD Lungs: Respiratory effort nonlabored Abd: soft, mild TTP over incision which is cdi. Mild to moderate distension. JP remains ss MSK: all 4 extremities are symmetrical with no cyanosis, clubbing, or edema. Skin: warm and dry Psych: A&Ox3 with an appropriate affect.    Lab Results:  Recent Labs    04/17/23 0509 04/18/23 0532  WBC 11.8* 9.8  HGB 11.8* 12.4  HCT 36.6 38.5  PLT 373 383   BMET Recent Labs    04/17/23 0509 04/18/23 0532  NA 139 139  K 3.4* 4.0  CL 104 103  CO2 26 27  GLUCOSE 112* 118*  BUN 18 13  CREATININE 0.61 0.56  CALCIUM 7.9* 8.3*   PT/INR No results for input(s): LABPROT, INR in the last 72 hours. CMP     Component Value Date/Time   NA 139 04/18/2023 0532   K 4.0 04/18/2023 0532   CL 103 04/18/2023 0532   CO2 27 04/18/2023 0532   GLUCOSE 118 (H) 04/18/2023 0532   BUN 13 04/18/2023 0532   CREATININE 0.56 04/18/2023 0532   CALCIUM 8.3 (L) 04/18/2023 0532   PROT 5.2 (L) 04/17/2023 0509   ALBUMIN  2.5 (L) 04/17/2023 0509   AST 20 04/17/2023 0509   ALT 17 04/17/2023 0509   ALKPHOS 56 04/17/2023 0509   BILITOT 0.6 04/17/2023 0509   GFRNONAA >60 04/18/2023 0532   Lipase     Component Value Date/Time   LIPASE <10 (L) 04/09/2023 1642       Studies/Results: No results  found.  Anti-infectives: Anti-infectives (From admission, onward)    Start     Dose/Rate Route Frequency Ordered Stop   04/16/23 1400  cefTRIAXone  (ROCEPHIN ) 2 g in sodium chloride  0.9 % 100 mL IVPB        2 g 200 mL/hr over 30 Minutes Intravenous Every 24 hours 04/16/23 1012     04/16/23 1100  metroNIDAZOLE  (FLAGYL ) IVPB 500 mg        500 mg 100 mL/hr over 60 Minutes Intravenous Every 12 hours 04/16/23 1012     04/14/23 2300  piperacillin -tazobactam (ZOSYN ) IVPB 3.375 g  Status:  Discontinued        3.375 g 12.5 mL/hr over 240 Minutes Intravenous Every 8 hours 04/14/23 1840 04/16/23 1012   04/14/23 1500  piperacillin -tazobactam (ZOSYN ) IVPB 3.375 g  Status:  Discontinued        3.375 g 12.5 mL/hr over 240 Minutes Intravenous  Once 04/14/23 1450 04/14/23 1838   04/14/23 1443  piperacillin -tazobactam (ZOSYN ) 3.375 GM/50ML IVPB       Note to Pharmacy: Adrien Cousin: cabinet override      04/14/23 1443 04/15/23 0259   04/14/23 0600  ceFAZolin  (ANCEF ) IVPB 2g/100 mL premix        2 g 200 mL/hr over 30 Minutes Intravenous On call to O.R. 04/13/23 1701 04/14/23  1309   04/14/23 0600  metroNIDAZOLE  (FLAGYL ) IVPB 500 mg        500 mg 100 mL/hr over 60 Minutes Intravenous On call to O.R. 04/13/23 1701 04/14/23 1329        Assessment/Plan  Pelvic abscess, partial large bowel obstruction   POD4 TAH b/l salpingo-oophorectomy, drainage of pelvic abscess cavity Dr. Eldonna 2/4  Drain SS, 87 cc recorded in last 24 hr +BF,   On bowel regimen   Mobilizing. AF, WBC now normalized  FEN: sips liquids/fulls, IVF per primary ID: rocephin /flagyl  VTE: lovenox  Foley: as per primary  -Case has been discussed with Dr. Eletha.  Made mention that plan will be for potential flex sig over the next day or 2 with no reliable return of bowel function to further assess - this is reasonable to me as well. Cont BID miralax  for now and monitor  I reviewed Consultant gyn/onc notes, last 24 h vitals and  pain scores, last 48 h intake and output, last 24 h labs and trends, and last 24 h imaging results.   I spent a total of 35 minutes in both face-to-face and non-face-to-face activities, excluding procedures performed, for this visit on the date of this encounter.    LOS: 8 days   Lonni Pizza, MD [facility], A [facility]

## 2023-04-18 NOTE — Assessment & Plan Note (Addendum)
 Secondary to mass effect, status post operative intervention Patient passing flatus and has passed a small bowel movement in the past 24 hours however overall bowel movements have been extremely difficult despite laxatives. Will defer advancement of diet to general surgery

## 2023-04-18 NOTE — Progress Notes (Signed)
 Inpatient Gyn Onc Progress Note  Subjective: Small flatus/-BM.  Less postprandial pain.  Vital signs in last 24 hours: Temp:  [98.2 F (36.8 C)-98.6 F (37 C)] 98.6 F (37 C) (02/08 0522) Pulse Rate:  [92-96] 92 (02/08 0522) Resp:  [14-17] 14 (02/08 0522) BP: (132-143)/(88-90) 132/90 (02/08 0522) SpO2:  [98 %-99 %] 98 % (02/08 0522) Last BM Date : 04/16/23  Intake/Output from previous day: 02/07 0701 - 02/08 0700 In: 1210 [P.O.:1210] Out: 487 [Urine:400; Drains:87]  Physical Examination: General: Alert, oriented, no acute distress. HEENT: Normocephalic, atraumatic. Sclera anicteric.  Chest: Normal work of breathing. Lungs clear.  Cardiovascular: Regular rate and rhythm. Abdomen: Distended, rounded, tympanitic, soft. Hypoactive BS. Abdominal midline incision with op site dressing intact with no drainage noted underneath. Surgical drain with serosanguinous output Extremities: Warm, well perfused.  No edema bilaterally.  Skin: No rashes or lesions noted.  Labs: WBC/Hgb/Hct/Plts:  9.8/12.4/38.5/383 (02/08 0532) BUN/Cr/glu/ALT/AST/amyl/lip:  13/0.56/--/--/--/--/-- (02/08 0532)   Assessment/Plan: 68 y.o. s/p total abdominal hysterectomy, bilateral salpingo-oophorectomy, drainage of pelvic abscess cavity, drain placement on 04/14/23 with Dr. Hoy Masters. Colonic obstruction 2/2 mass effect; suspect persistent dysmotility despite aggressive bowel regimen.  Notes from Gen Surg/Hospitalist team appreciated.  LOS: 8 days    Olam Mill, MD 04/18/2023, 10:59 AM

## 2023-04-18 NOTE — Assessment & Plan Note (Signed)
 Recurrent, replacing with additional doses of oral potassium

## 2023-04-19 DIAGNOSIS — E876 Hypokalemia: Secondary | ICD-10-CM | POA: Diagnosis not present

## 2023-04-19 DIAGNOSIS — Z90710 Acquired absence of both cervix and uterus: Secondary | ICD-10-CM | POA: Diagnosis not present

## 2023-04-19 DIAGNOSIS — N739 Female pelvic inflammatory disease, unspecified: Secondary | ICD-10-CM | POA: Diagnosis not present

## 2023-04-19 DIAGNOSIS — K56609 Unspecified intestinal obstruction, unspecified as to partial versus complete obstruction: Secondary | ICD-10-CM | POA: Diagnosis not present

## 2023-04-19 LAB — RENAL FUNCTION PANEL
Albumin: 2.5 g/dL — ABNORMAL LOW (ref 3.5–5.0)
Anion gap: 9 (ref 5–15)
BUN: 9 mg/dL (ref 8–23)
CO2: 26 mmol/L (ref 22–32)
Calcium: 8.1 mg/dL — ABNORMAL LOW (ref 8.9–10.3)
Chloride: 104 mmol/L (ref 98–111)
Creatinine, Ser: 0.63 mg/dL (ref 0.44–1.00)
GFR, Estimated: >60 mL/min (ref >60)
Glucose, Bld: 118 mg/dL — ABNORMAL HIGH (ref 70–99)
Phosphorus: 3.1 mg/dL (ref 2.5–4.6)
Potassium: 3.7 mmol/L (ref 3.5–5.1)
Sodium: 139 mmol/L (ref 135–145)

## 2023-04-19 NOTE — Assessment & Plan Note (Addendum)
 Patient is status post operative intervention with TAH/BSO and drainage of pelvic abscess on 2/4 by Dr. Daisey Dryer as noted above Leukocytosis normalized Intraoperative cultures revealing E. coli resistant to ampicillin with intermediate resistance to ampicillin/sulbactam.  Was on Zosyn  from 2/4 to 2/6 and currently receiving ceftriaxone  and Flagyl  since 2/6.   Case discussed with infectious disease pharmacist who is recommending eventual transition to cefadroxil and oral Flagyl  when discharged will complete course of antibiotics totaling 3 weeks from date of drainage (2/25).   Advancing to soft diet today.   Abdominal pain improving Patient moved bowels several times since yesterday.  As needed opiate-based analgesics for substantial associated pain

## 2023-04-19 NOTE — Progress Notes (Signed)
 Progress Note  5 Days Post-Op  Subjective: Continues to pass flatus.  Has now had multiple bowel movements totaling 3.  Overall she is feeling much better.  Denies any nausea or vomiting.  Objective: Vital signs in last 24 hours: Temp:  [98.4 F (36.9 C)-98.7 F (37.1 C)] 98.4 F (36.9 C) (02/09 0503) Pulse Rate:  [81-91] 81 (02/09 0503) Resp:  [16] 16 (02/09 0503) BP: (122-127)/(80-88) 122/86 (02/09 0503) SpO2:  [97 %-100 %] 98 % (02/09 0503) Last BM Date : 04/18/23  Intake/Output from previous day: 02/08 0701 - 02/09 0700 In: 1940 [P.O.:1640; IV Piggyback:300] Out: 110 [Drains:110] Intake/Output this shift: Total I/O In: -  Out: 10 [Drains:10]  PE: General: pleasant, WD, female who is sitting up in NAD Lungs: Respiratory effort nonlabored Abd: soft, mild TTP over incision which is cdi. Mild to moderate distension. JP remains ss MSK: all 4 extremities are symmetrical with no cyanosis, clubbing, or edema. Skin: warm and dry Psych: A&Ox3 with an appropriate affect.    Lab Results:  Recent Labs    04/17/23 0509 04/18/23 0532  WBC 11.8* 9.8  HGB 11.8* 12.4  HCT 36.6 38.5  PLT 373 383   BMET Recent Labs    04/18/23 0532 04/19/23 0523  NA 139 139  K 4.0 3.7  CL 103 104  CO2 27 26  GLUCOSE 118* 118*  BUN 13 9  CREATININE 0.56 0.63  CALCIUM 8.3* 8.1*   PT/INR No results for input(s): LABPROT, INR in the last 72 hours. CMP     Component Value Date/Time   NA 139 04/19/2023 0523   K 3.7 04/19/2023 0523   CL 104 04/19/2023 0523   CO2 26 04/19/2023 0523   GLUCOSE 118 (H) 04/19/2023 0523   BUN 9 04/19/2023 0523   CREATININE 0.63 04/19/2023 0523   CALCIUM 8.1 (L) 04/19/2023 0523   PROT 5.2 (L) 04/17/2023 0509   ALBUMIN  2.5 (L) 04/19/2023 0523   AST 20 04/17/2023 0509   ALT 17 04/17/2023 0509   ALKPHOS 56 04/17/2023 0509   BILITOT 0.6 04/17/2023 0509   GFRNONAA >60 04/19/2023 0523   Lipase     Component Value Date/Time   LIPASE <10 (L)  04/09/2023 1642       Studies/Results: No results found.  Anti-infectives: Anti-infectives (From admission, onward)    Start     Dose/Rate Route Frequency Ordered Stop   04/16/23 1400  cefTRIAXone  (ROCEPHIN ) 2 g in sodium chloride  0.9 % 100 mL IVPB        2 g 200 mL/hr over 30 Minutes Intravenous Every 24 hours 04/16/23 1012     04/16/23 1100  metroNIDAZOLE  (FLAGYL ) IVPB 500 mg        500 mg 100 mL/hr over 60 Minutes Intravenous Every 12 hours 04/16/23 1012     04/14/23 2300  piperacillin -tazobactam (ZOSYN ) IVPB 3.375 g  Status:  Discontinued        3.375 g 12.5 mL/hr over 240 Minutes Intravenous Every 8 hours 04/14/23 1840 04/16/23 1012   04/14/23 1500  piperacillin -tazobactam (ZOSYN ) IVPB 3.375 g  Status:  Discontinued        3.375 g 12.5 mL/hr over 240 Minutes Intravenous  Once 04/14/23 1450 04/14/23 1838   04/14/23 1443  piperacillin -tazobactam (ZOSYN ) 3.375 GM/50ML IVPB       Note to Pharmacy: Adrien Cousin: cabinet override      04/14/23 1443 04/15/23 0259   04/14/23 0600  ceFAZolin  (ANCEF ) IVPB 2g/100 mL premix  2 g 200 mL/hr over 30 Minutes Intravenous On call to O.R. 04/13/23 1701 04/14/23 1309   04/14/23 0600  metroNIDAZOLE  (FLAGYL ) IVPB 500 mg        500 mg 100 mL/hr over 60 Minutes Intravenous On call to O.R. 04/13/23 1701 04/14/23 1329        Assessment/Plan  Pelvic abscess, partial large bowel obstruction   POD5 TAH b/l salpingo-oophorectomy, drainage of pelvic abscess cavity Dr. Eldonna 2/4  Drain SS, 87 cc recorded in last 24 hr  On bowel regimen -has now had 3 significant bowel movements and is feeling much better.  Clinically clearly improving.  Would recommend colonoscopy for further workup in days ahead if continues to do well. If cessation of bowel fxn, flex sig instead   Mobilizing. AF, WBC now normalized  FEN: per primary ID: rocephin /flagyl  VTE: lovenox  Foley: as per primary   Cont BID miralax  for now and monitor  I reviewed  Consultant gyn/onc notes, last 24 h vitals and pain scores, last 48 h intake and output, last 24 h labs and trends, and last 24 h imaging results.   I spent a total of 35 minutes in both face-to-face and non-face-to-face activities, excluding procedures performed, for this visit on the date of this encounter.    LOS: 9 days   Lonni Pizza, MD [facility]

## 2023-04-19 NOTE — Assessment & Plan Note (Addendum)
-  replaced

## 2023-04-19 NOTE — Assessment & Plan Note (Addendum)
 Secondary to mass effect, status post operative intervention Now patient is moving her bowels post operatively.  Monitor while hospitalized for 1-2 more days.  If bowel movements sustained may go home with surgery clearance.  If patient unable to move bowels again my need flex sig.

## 2023-04-19 NOTE — Progress Notes (Signed)
 Inpatient Gyn Onc Progress Note  Subjective: Multiple BMs; minimal pain  Vital signs in last 24 hours: Temp:  [98.4 F (36.9 C)-98.7 F (37.1 C)] 98.4 F (36.9 C) (02/09 0503) Pulse Rate:  [81-91] 81 (02/09 0503) Resp:  [16] 16 (02/09 0503) BP: (122-127)/(80-88) 122/86 (02/09 0503) SpO2:  [97 %-100 %] 98 % (02/09 0503) Last BM Date : 04/18/23  Intake/Output from previous day: 02/08 0701 - 02/09 0700 In: 1940 [P.O.:1640; IV Piggyback:300] Out: 110 [Drains:110]  Physical Examination: General: Alert, oriented, no acute distress. HEENT: Normocephalic, atraumatic. Sclera anicteric.  Chest: Normal work of breathing. Lungs clear.  Cardiovascular: Regular rate and rhythm. Abdomen: Softly distended. Normoactive BS. Abdominal midline incision with op site dressing intact with no drainage noted underneath. Surgical drain with serosanguinous output Extremities: Warm, well perfused.  No edema bilaterally.  Skin: No rashes or lesions noted.  Labs:   BUN/Cr/glu/ALT/AST/amyl/lip:  9/0.63/--/--/--/--/-- (02/09 9476)   Assessment/Plan: 68 y.o. s/p total abdominal hysterectomy, bilateral salpingo-oophorectomy, drainage of pelvic abscess cavity, drain placement on 04/14/23 with Dr. Hoy Masters. Colonic obstruction 2/2 mass effect;  Returning bowel function.  Notes from Gen Surg/Hospitalist team appreciated.  LOS: 9 days    Olam Mill, MD 04/19/2023, 11:21 AM

## 2023-04-19 NOTE — Progress Notes (Signed)
 PROGRESS NOTE   Tonya Schneider  FMW:[id number] DOB: [birth year] DOA: 04/09/2023 PCP: Katina Pfeiffer, PA-C   Date of Service: the patient was seen and examined on 04/19/2023  Brief Narrative:  68 y.o. female without significant past medical history sent to the hospital for abdominal pain and colon obstruction, she related started about a month prior to admission with having crampy lower abdominal pain with intermittent diarrhea and constipation 3 days prior to admission she started having severe pain and ongoing watery diarrhea.  CT scan of the abdomen pelvis showed large fluid collection about 11 cm, vaginal ultrasound confirmed large cystic mass of the left adnexal region measuring about 11 cm.   Gynecology oncology and general surgery were both consulted.  Dr. Eldonna with gynecology performed total abdominal hysterectomy with bilateral salpingo-oophorectomy and drainage of pelvic abscess with drain placement on 2/4.  Patient has since been on IV Zosyn  followed by Ceftriaxone  and Flagyl  with intent to treat with total of 3 weeks of abx.  Post-op course has been complicated by dysmotility however patient has finally begun moving her bowels on several laxatives.     Assessment & Plan Pelvic abscess in female Patient is status post operative intervention with TAH/BSO and drainage of pelvic abscess on 2/4 by Dr. Eldonna as noted above Leukocytosis normalized Intraoperative cultures revealing E. coli resistant to ampicillin with intermediate resistance to ampicillin/sulbactam.  Was on Zosyn  from 2/4 to 2/6 and currently receiving ceftriaxone  and Flagyl  since 2/6.   Case discussed with infectious disease pharmacist who is recommending eventual transition to cefadroxil and oral Flagyl  when discharged will complete course of antibiotics totaling 3 weeks from date of drainage (2/25).   Advancing to soft diet today.   Abdominal pain improving Patient moved bowels several times since yesterday.  As  needed opiate-based analgesics for substantial associated pain  S/P TAH-BSO (total abdominal hysterectomy and bilateral salpingo-oophorectomy) Biopsies obtained revealed no evidence of malignancy.   Gynecology oncology and general surgery both following Remainder of assessment and plan as above Colonic obstruction (HCC) Secondary to mass effect, status post operative intervention Now patient is moving her bowels post operatively.  Monitor while hospitalized for 1-2 more days.  If bowel movements sustained may go home with surgery clearance.  If patient unable to move bowels again my need flex sig.   Hypokalemia replaced   Subjective:  Patient states abdominal pain is moderate in intensity.  Patient reports at least 3 dark bowel movements (non bloody) since yesterday.   Patient has been ambulating.  Physical Exam:  Vitals:   04/18/23 2126 04/19/23 0503 04/19/23 1416 04/19/23 2159  BP: 127/88 122/86 122/87 133/79  Pulse: 86 81 82 92  Resp: 16 16 18 18   Temp: 98.7 F (37.1 C) 98.4 F (36.9 C) 98.5 F (36.9 C) 99.3 F (37.4 C)  TempSrc: Oral Oral Oral Oral  SpO2: 97% 98% 100% 98%  Weight:      Height:        Constitutional: Awake alert and oriented x3, patient is currently not in distress. Skin: Anterior abdominal wall incision clean dry and intact dressing. Eyes: Pupils are equally reactive to light.  No evidence of scleral icterus or conjunctival pallor.  ENMT: Moist mucous membranes noted.  Posterior pharynx clear of any exudate or lesions.   Respiratory: clear to auscultation bilaterally, no wheezing, no crackles. Normal respiratory effort. No accessory muscle use.  Cardiovascular: Regular rate and rhythm, no murmurs / rubs / gallops. No extremity edema. 2+ pedal pulses. No  carotid bruits.  Abdomen: Slightly protuberant abdomen with lower abdominal tenderness.  Abdomen is soft.  Normoactive bowel sounds.. Musculoskeletal: No joint deformity upper and lower extremities. Good  ROM, no contractures. Normal muscle tone.    Data Reviewed:  I have personally reviewed and interpreted labs, imaging.  Significant findings are   CBC: Recent Labs  Lab 04/14/23 0511 04/15/23 0457 04/17/23 0509 04/18/23 0532  WBC 9.4 18.9* 11.8* 9.8  NEUTROABS  --   --  9.7* 7.7  HGB 12.4 13.2 11.8* 12.4  HCT 39.5 41.7 36.6 38.5  MCV 89.4 89.7 88.8 88.9  PLT 353 445* 373 383   Basic Metabolic Panel: Recent Labs  Lab 04/15/23 0457 04/16/23 0531 04/17/23 0509 04/18/23 0532 04/19/23 0523  NA 144 139 139 139 139  K 4.0 3.7 3.4* 4.0 3.7  CL 104 99 104 103 104  CO2 25 26 26 27 26   GLUCOSE 138* 127* 112* 118* 118*  BUN 16 22 18 13 9   CREATININE 0.63 0.83 0.61 0.56 0.63  CALCIUM 8.4* 8.5* 7.9* 8.3* 8.1*  MG  --   --  2.1 2.2  --   PHOS  --   --   --   --  3.1   GFR: Estimated Creatinine Clearance: 63.7 mL/min (by C-G formula based on SCr of 0.63 mg/dL). Liver Function Tests: Recent Labs  Lab 04/15/23 1353 04/17/23 0509 04/19/23 0523  AST 14* 20  --   ALT 15 17  --   ALKPHOS 65 56  --   BILITOT 1.0 0.6  --   PROT 6.4* 5.2*  --   ALBUMIN  3.1* 2.5* 2.5*    Code Status:  Full code.  Code status decision has been confirmed with: patient Family Communication: Spouse is at bedside who has been updated on plan of care.   Severity of Illness:  The appropriate patient status for this patient is INPATIENT. Inpatient status is judged to be reasonable and necessary in order to provide the required intensity of service to ensure the patient's safety. The patient's presenting symptoms, physical exam findings, and initial radiographic and laboratory data in the context of their chronic comorbidities is felt to place them at high risk for further clinical deterioration. Furthermore, it is not anticipated that the patient will be medically stable for discharge from the hospital within 2 midnights of admission.   * I certify that at the point of admission it is my clinical  judgment that the patient will require inpatient hospital care spanning beyond 2 midnights from the point of admission due to high intensity of service, high risk for further deterioration and high frequency of surveillance required.*  Time spent:  47 minutes  Author:  Zachary JINNY Ba MD

## 2023-04-19 NOTE — Assessment & Plan Note (Addendum)
 Biopsies obtained revealed no evidence of malignancy.   Gynecology oncology and general surgery both following Remainder of assessment and plan as above

## 2023-04-20 DIAGNOSIS — N739 Female pelvic inflammatory disease, unspecified: Secondary | ICD-10-CM | POA: Diagnosis not present

## 2023-04-20 LAB — CBC WITH DIFFERENTIAL/PLATELET
Abs Immature Granulocytes: 0.15 10*3/uL — ABNORMAL HIGH (ref 0.00–0.07)
Basophils Absolute: 0 10*3/uL (ref 0.0–0.1)
Basophils Relative: 0 %
Eosinophils Absolute: 0.2 10*3/uL (ref 0.0–0.5)
Eosinophils Relative: 2 %
HCT: 34.6 % — ABNORMAL LOW (ref 36.0–46.0)
Hemoglobin: 11.3 g/dL — ABNORMAL LOW (ref 12.0–15.0)
Immature Granulocytes: 2 %
Lymphocytes Relative: 9 %
Lymphs Abs: 0.7 10*3/uL (ref 0.7–4.0)
MCH: 28.7 pg (ref 26.0–34.0)
MCHC: 32.7 g/dL (ref 30.0–36.0)
MCV: 87.8 fL (ref 80.0–100.0)
Monocytes Absolute: 0.6 10*3/uL (ref 0.1–1.0)
Monocytes Relative: 7 %
Neutro Abs: 6.5 10*3/uL (ref 1.7–7.7)
Neutrophils Relative %: 80 %
Platelets: 335 10*3/uL (ref 150–400)
RBC: 3.94 MIL/uL (ref 3.87–5.11)
RDW: 13.5 % (ref 11.5–15.5)
WBC: 8.1 10*3/uL (ref 4.0–10.5)
nRBC: 0 % (ref 0.0–0.2)

## 2023-04-20 LAB — COMPREHENSIVE METABOLIC PANEL
ALT: 69 U/L — ABNORMAL HIGH (ref 0–44)
AST: 84 U/L — ABNORMAL HIGH (ref 15–41)
Albumin: 2.3 g/dL — ABNORMAL LOW (ref 3.5–5.0)
Alkaline Phosphatase: 68 U/L (ref 38–126)
Anion gap: 7 (ref 5–15)
BUN: 11 mg/dL (ref 8–23)
CO2: 26 mmol/L (ref 22–32)
Calcium: 8 mg/dL — ABNORMAL LOW (ref 8.9–10.3)
Chloride: 104 mmol/L (ref 98–111)
Creatinine, Ser: 0.63 mg/dL (ref 0.44–1.00)
GFR, Estimated: >60 mL/min (ref >60)
Glucose, Bld: 105 mg/dL — ABNORMAL HIGH (ref 70–99)
Potassium: 3.7 mmol/L (ref 3.5–5.1)
Sodium: 137 mmol/L (ref 135–145)
Total Bilirubin: 0.6 mg/dL (ref 0.0–1.2)
Total Protein: 5.1 g/dL — ABNORMAL LOW (ref 6.5–8.1)

## 2023-04-20 LAB — CBC
HCT: 39.7 % (ref 36.0–46.0)
Hemoglobin: 12.6 g/dL (ref 12.0–15.0)
MCH: 28.2 pg (ref 26.0–34.0)
MCHC: 31.7 g/dL (ref 30.0–36.0)
MCV: 88.8 fL (ref 80.0–100.0)
Platelets: 420 10*3/uL — ABNORMAL HIGH (ref 150–400)
RBC: 4.47 MIL/uL (ref 3.87–5.11)
RDW: 13.6 % (ref 11.5–15.5)
WBC: 8.8 10*3/uL (ref 4.0–10.5)
nRBC: 0 % (ref 0.0–0.2)

## 2023-04-20 LAB — MAGNESIUM: Magnesium: 2.3 mg/dL (ref 1.7–2.4)

## 2023-04-20 MED ORDER — IPRATROPIUM-ALBUTEROL 0.5-2.5 (3) MG/3ML IN SOLN: 3.0000 mL | RESPIRATORY_TRACT | Status: DC | PRN

## 2023-04-20 MED ORDER — HYDRALAZINE HCL 20 MG/ML IJ SOLN: 10.0000 mg | INTRAMUSCULAR | Status: DC | PRN

## 2023-04-20 MED ORDER — GUAIFENESIN 100 MG/5ML PO LIQD: 5.0000 mL | ORAL | Status: DC | PRN

## 2023-04-20 MED ORDER — METOPROLOL TARTRATE 5 MG/5ML IV SOLN: 5.0000 mg | INTRAVENOUS | Status: DC | PRN

## 2023-04-20 MED ORDER — SODIUM CHLORIDE 0.9 % IV SOLN: INTRAVENOUS | Status: AC

## 2023-04-20 MED ORDER — PEG 3350-KCL-NA BICARB-NACL 420 G PO SOLR
4000.0000 mL | Freq: Once | ORAL | Status: AC
  Administered 2023-04-21: 4000 mL via ORAL

## 2023-04-20 NOTE — Progress Notes (Signed)
 PROGRESS NOTE    Tonya Schneider  WUJ:[id number] DOB: [birth year] DOA: 04/09/2023 PCP: Darnelle Elders, PA-C    Brief Narrative:   68 y.o. female without significant past medical history sent to the hospital for abdominal pain and colon obstruction, she related started about a month prior to admission with having crampy lower abdominal pain with intermittent diarrhea and constipation 3 days prior to admission she started having severe pain and ongoing watery diarrhea.  CT scan of the abdomen pelvis showed large fluid collection about 11 cm, vaginal ultrasound confirmed large cystic mass of the left adnexal region measuring about 11 cm.    Gynecology oncology and general surgery were both consulted.  Dr. Daisey Dryer with gynecology performed total abdominal hysterectomy with bilateral salpingo-oophorectomy and drainage of pelvic abscess with drain placement on 2/4.   Patient has since been on IV Zosyn  followed by Ceftriaxone  and Flagyl  with intent to treat with total of 3 weeks of abx.  Post-op course has been complicated by dysmotility however patient has finally begun moving her bowels on several laxatives.    Assessment & Plan:  Principal Problem:   Pelvic abscess in female Active Problems:   Colonic obstruction (HCC)   Hypokalemia   S/P TAH-BSO (total abdominal hysterectomy and bilateral salpingo-oophorectomy)   Pelvic abscess in female Patient is status post operative intervention with TAH/BSO and drainage of pelvic abscess on 2/4 by Dr. Daisey Dryer as noted above.  IntraOp cultures growing E. coli, adjust antibiotics accordingly.  Diet as tolerated.  Pain control per general surgery.  Total antibiotic course 3 weeks, EOT 2/25. -Discussed with [facility], plans on outpatient colonoscopy   S/P TAH-BSO (total abdominal hysterectomy and bilateral salpingo-oophorectomy) Biopsies negative for malignancy  Colonic obstruction (HCC) Secondary to mass effect, status post operative intervention.   General surgery following.  [facility] to help with outpatient arrangement for colonoscopy.  Hypokalemia replaced   DVT prophylaxis: Lovenox     Code Status: Full Code Family Communication:   Status is: Inpatient Remains inpatient appropriate because: Ongoing management per general surgery    Subjective:  Seen at bedside no complaints.  Examination:  General exam: Appears calm and comfortable  Respiratory system: Clear to auscultation. Respiratory effort normal. Cardiovascular system: S1 & S2 heard, RRR. No JVD, murmurs, rubs, gallops or clicks. No pedal edema. Gastrointestinal system: Abdomen is nondistended, soft and nontender. No organomegaly or masses felt. Normal bowel sounds heard. Central nervous system: Alert and oriented. No focal neurological deficits. Extremities: Symmetric 5 x 5 power. Skin: No rashes, lesions or ulcers Psychiatry: Judgement and insight appear normal. Mood & affect appropriate.                Diet Orders (From admission, onward)     Start     Ordered   04/19/23 1458  DIET SOFT Room service appropriate? Yes; Fluid consistency: Thin  Diet effective now       Question Answer Comment  Room service appropriate? Yes   Fluid consistency: Thin      04/19/23 1457            Objective: Vitals:   04/18/23 2126 04/19/23 0503 04/19/23 1416 04/19/23 2159  BP: 127/88 122/86 122/87 133/79  Pulse: 86 81 82 92  Resp: 16 16 18 18   Temp: 98.7 F (37.1 C) 98.4 F (36.9 C) 98.5 F (36.9 C) 99.3 F (37.4 C)  TempSrc: Oral Oral Oral Oral  SpO2: 97% 98% 100% 98%  Weight:      Height:  Intake/Output Summary (Last 24 hours) at 04/20/2023 1303 Last data filed at 04/20/2023 1000 Gross per 24 hour  Intake 540 ml  Output 20 ml  Net 520 ml   Filed Weights   04/09/23 1636  Weight: 60.8 kg    Scheduled Meds:  docusate sodium   100 mg Oral BID   enoxaparin  (LOVENOX ) injection  40 mg Subcutaneous Q24H   feeding supplement  237 mL Oral  BID BM   multivitamin with minerals  1 tablet Oral Daily   polyethylene glycol  17 g Oral BID   senna  2 tablet Oral QHS   Continuous Infusions:  cefTRIAXone  (ROCEPHIN )  IV 2 g (04/19/23 1316)   metronidazole  500 mg (04/20/23 1022)    Nutritional status Signs/Symptoms: energy intake < 75% for > 7 days Interventions: Refer to RD note for recommendations, Boost Breeze, MVI Body mass index is 20.08 kg/m.  Data Reviewed:   CBC: Recent Labs  Lab 04/15/23 0457 04/17/23 0509 04/18/23 0532 04/20/23 0452 04/20/23 0849  WBC 18.9* 11.8* 9.8 8.1 8.8  NEUTROABS  --  9.7* 7.7 6.5  --   HGB 13.2 11.8* 12.4 11.3* 12.6  HCT 41.7 36.6 38.5 34.6* 39.7  MCV 89.7 88.8 88.9 87.8 88.8  PLT 445* 373 383 335 420*   Basic Metabolic Panel: Recent Labs  Lab 04/16/23 0531 04/17/23 0509 04/18/23 0532 04/19/23 0523 04/20/23 0452  NA 139 139 139 139 137  K 3.7 3.4* 4.0 3.7 3.7  CL 99 104 103 104 104  CO2 26 26 27 26 26   GLUCOSE 127* 112* 118* 118* 105*  BUN 22 18 13 9 11   CREATININE 0.83 0.61 0.56 0.63 0.63  CALCIUM 8.5* 7.9* 8.3* 8.1* 8.0*  MG  --  2.1 2.2  --  2.3  PHOS  --   --   --  3.1  --    GFR: Estimated Creatinine Clearance: 63.7 mL/min (by C-G formula based on SCr of 0.63 mg/dL). Liver Function Tests: Recent Labs  Lab 04/15/23 1353 04/17/23 0509 04/19/23 0523 04/20/23 0452  AST 14* 20  --  84*  ALT 15 17  --  69*  ALKPHOS 65 56  --  68  BILITOT 1.0 0.6  --  0.6  PROT 6.4* 5.2*  --  5.1*  ALBUMIN  3.1* 2.5* 2.5* 2.3*   No results for input(s): "LIPASE", "AMYLASE" in the last 168 hours. No results for input(s): "AMMONIA" in the last 168 hours. Coagulation Profile: No results for input(s): "INR", "PROTIME" in the last 168 hours. Cardiac Enzymes: No results for input(s): "CKTOTAL", "CKMB", "CKMBINDEX", "TROPONINI" in the last 168 hours. BNP (last 3 results) No results for input(s): "PROBNP" in the last 8760 hours. HbA1C: No results for input(s): "HGBA1C" in the last  72 hours. CBG: No results for input(s): "GLUCAP" in the last 168 hours. Lipid Profile: No results for input(s): "CHOL", "HDL", "LDLCALC", "TRIG", "CHOLHDL", "LDLDIRECT" in the last 72 hours. Thyroid Function Tests: No results for input(s): "TSH", "T4TOTAL", "FREET4", "T3FREE", "THYROIDAB" in the last 72 hours. Anemia Panel: No results for input(s): "VITAMINB12", "FOLATE", "FERRITIN", "TIBC", "IRON", "RETICCTPCT" in the last 72 hours. Sepsis Labs: No results for input(s): "PROCALCITON", "LATICACIDVEN" in the last 168 hours.  Recent Results (from the past 240 hours)  Surgical pcr screen     Status: Abnormal   Collection Time: 04/13/23  8:32 PM   Specimen: Nasal Mucosa; Nasal Swab  Result Value Ref Range Status   MRSA, PCR NEGATIVE NEGATIVE Final   Staphylococcus aureus  POSITIVE (A) NEGATIVE Final    Comment: (NOTE) The Xpert SA Assay (FDA approved for NASAL specimens in patients 10 years of age and older), is one component of a comprehensive surveillance program. It is not intended to diagnose infection nor to guide or monitor treatment. Performed at [facility], [address], [place], [place] [address]   Aerobic/Anaerobic Culture w Gram Stain (surgical/deep wound)     Status: None   Collection Time: 04/14/23  2:42 PM   Specimen: PATH Soft tissue  Result Value Ref Range Status   Specimen Description TISSUE ABSCESS WALL  Final   Special Requests NONE  Final   Gram Stain   Final    FEW WBC PRESENT,BOTH PMN AND MONONUCLEAR FEW GRAM NEGATIVE RODS    Culture   Final    ABUNDANT ESCHERICHIA COLI FEW PHOCAEICOLA VULGATUS BETA LACTAMASE POSITIVE    Report Status 04/17/2023 FINAL  Final   Organism ID, Bacteria ESCHERICHIA COLI  Final   Organism ID, Bacteria ESCHERICHIA COLI  Final      Susceptibility   Escherichia coli - MIC*    AMPICILLIN >=32 RESISTANT Resistant     CEFEPIME <=0.12 SENSITIVE Sensitive     CEFTAZIDIME <=1 SENSITIVE Sensitive      CEFTRIAXONE  <=0.25 SENSITIVE Sensitive     CIPROFLOXACIN  <=0.25 SENSITIVE Sensitive     GENTAMICIN <=1 SENSITIVE Sensitive     IMIPENEM <=0.25 SENSITIVE Sensitive     TRIMETH/SULFA <=20 SENSITIVE Sensitive     AMPICILLIN/SULBACTAM 16 INTERMEDIATE Intermediate     PIP/TAZO <=4 SENSITIVE Sensitive ug/mL   Escherichia coli - KIRBY BAUER*    CEFAZOLIN  INTERMEDIATE Intermediate     * ABUNDANT ESCHERICHIA COLI    ABUNDANT ESCHERICHIA COLI         Radiology Studies: No results found.         LOS: 10 days   Time spent= 35 mins    Maggie Schooner, MD [facility]  If 7PM-7AM, please contact night-coverage  04/20/2023, 1:03 PM

## 2023-04-20 NOTE — Progress Notes (Signed)
Inpatient Gyn Onc Progress Note  Subjective: Pt reports doing well. Having multiple stools (solid and loose), 6 yesterday. No nausea or emesis. Tolerating soft diet. Ate an omelette and mac and cheese yesterday and pancakes this am. Continues to ambulate in the halls frequently. No significant abdominal pain reported. Continuing to void without difficulty. Asking about expected discharge date. Husband at the bedside.   Objective: Vital signs in last 24 hours: Temp:  [98.5 F (36.9 C)-99.3 F (37.4 C)] 99.3 F (37.4 C) (02/09 2159) Pulse Rate:  [82-92] 92 (02/09 2159) Resp:  [18] 18 (02/09 2159) BP: (122-133)/(79-87) 133/79 (02/09 2159) SpO2:  [98 %-100 %] 98 % (02/09 2159) Last BM Date : 04/18/23  Intake/Output from previous day: 02/09 0701 - 02/10 0700 In: 420 [P.O.:120; IV Piggyback:300] Out: 10 [Drains:10]  Physical Examination (performed by Dr. Alvester Morin): General: Alert, oriented, no acute distress. HEENT: Normocephalic, atraumatic. Sclera anicteric.  Chest: Normal work of breathing. Abdomen: Less distended, rounded, mildly tympanitic, soft. Mild TTP near drain site. Active bowel sounds. Abdominal midline incision with op site dressing intact with no drainage noted underneath. Surgical drain with serous output. Op site dressing removed along with drain dressing changed.  Extremities: Warm, well perfused.  No edema bilaterally.   Labs: WBC/Hgb/Hct/Plts:  8.1/11.3/34.6/335 (02/10 1610) BUN/Cr/glu/ALT/AST/amyl/lip:  11/0.63/--/69/84/--/-- (02/10 9604) CEA: 14.8 (elevated) CA125: 24.6 (normal) Abundant E coli on abscess wall taken intra-op Path has returned from procedure on 04/14/23: A.   UTERUS, CERVIX, BILATERAL FALLOPIAN TUBES AND OVARIES: Cervix     Nabothian cyst     Negative for dysplasia Endometrium     Atrophic     Benign endometrial polyps     Negative for endometrial intraepithelial neoplasia (EIN) and malignancy Myometrium     Unremarkable     Negative for  malignancy Right ovary     Benign mucinous cyst, 3 mm     Negative for endometriosis and malignancy Left ovary     Mild inflammation with reactive serosal mesothelial changes and mild serositis     Negative for endometriosis and malignancy Right fallopian tube     Unremarkable     Negative for endometriosis and malignancy Left fallopian tube     Focal mild nonspecific inflammation     Negative for endometriosis and malignancy  B.   PERINEUM CUL DE SAC, POSTERIOR, EXCISION: Granulation tissue with abundant fibrin and hemorrhage Negative for endometriosis and malignancy   Assessment/Plan: 68 y.o. s/p total abdominal hysterectomy, bilateral salpingo-oophorectomy, drainage of pelvic abscess cavity, drain placement on 04/14/23 with Dr. Clide Cliff. Stable post-operatively. Currently on soft diet per [facility]CS Team. Abxs changed to rocephin and flagyl IV on 04/16/2023. Can be changed to oral regimen per Dr. Alvester Morin. From post-op GYN ONC standpoint, patient meeting milestones. Surgical drain can be removed at discharge. Discussed plan for outpatient colonoscopy with patient. Continue current plan of care.   LOS: 10 days    Tonya Schneider 04/20/2023, 9:11 AM

## 2023-04-20 NOTE — Progress Notes (Addendum)
 6 Days Post-Op  Subjective: CC: Patient finished all of her soft breakfast tray. No n/v. No abdominal pain currently. Passing flatus. Several liquid with mixed solid bm's overnight. Drain w/ 20cc/24 hours.   Objective: Vital signs in last 24 hours: Temp:  [98.5 F (36.9 C)-99.3 F (37.4 C)] 99.3 F (37.4 C) (02/09 2159) Pulse Rate:  [82-92] 92 (02/09 2159) Resp:  [18] 18 (02/09 2159) BP: (122-133)/(79-87) 133/79 (02/09 2159) SpO2:  [98 %-100 %] 98 % (02/09 2159) Last BM Date : 04/18/23  Intake/Output from previous day: 02/09 0701 - 02/10 0700 In: 420 [P.O.:120; IV Piggyback:300] Out: 10 [Drains:10] Intake/Output this shift: Total I/O In: 120 [P.O.:120] Out: 20 [Drains:20]  PE: Gen:  Alert, NAD, pleasant Abd: Soft, mild distension, appropriately ttp around incision - otherwise NT. Midline wound with honeycomb dressing in place, JP drain SS.   Lab Results:  Recent Labs    04/20/23 0452 04/20/23 0849  WBC 8.1 8.8  HGB 11.3* 12.6  HCT 34.6* 39.7  PLT 335 420*   BMET Recent Labs    04/19/23 0523 04/20/23 0452  NA 139 137  K 3.7 3.7  CL 104 104  CO2 26 26  GLUCOSE 118* 105*  BUN 9 11  CREATININE 0.63 0.63  CALCIUM 8.1* 8.0*   PT/INR No results for input(s): "LABPROT", "INR" in the last 72 hours. CMP     Component Value Date/Time   NA 137 04/20/2023 0452   K 3.7 04/20/2023 0452   CL 104 04/20/2023 0452   CO2 26 04/20/2023 0452   GLUCOSE 105 (H) 04/20/2023 0452   BUN 11 04/20/2023 0452   CREATININE 0.63 04/20/2023 0452   CALCIUM 8.0 (L) 04/20/2023 0452   PROT 5.1 (L) 04/20/2023 0452   ALBUMIN  2.3 (L) 04/20/2023 0452   AST 84 (H) 04/20/2023 0452   ALT 69 (H) 04/20/2023 0452   ALKPHOS 68 04/20/2023 0452   BILITOT 0.6 04/20/2023 0452   GFRNONAA >60 04/20/2023 0452   Lipase     Component Value Date/Time   LIPASE <10 (L) 04/09/2023 1642    Studies/Results: No results found.  Anti-infectives: Anti-infectives (From admission, onward)     Start     Dose/Rate Route Frequency Ordered Stop   04/16/23 1400  cefTRIAXone  (ROCEPHIN ) 2 g in sodium chloride  0.9 % 100 mL IVPB        2 g 200 mL/hr over 30 Minutes Intravenous Every 24 hours 04/16/23 1012     04/16/23 1100  metroNIDAZOLE  (FLAGYL ) IVPB 500 mg        500 mg 100 mL/hr over 60 Minutes Intravenous Every 12 hours 04/16/23 1012     04/14/23 2300  piperacillin -tazobactam (ZOSYN ) IVPB 3.375 g  Status:  Discontinued        3.375 g 12.5 mL/hr over 240 Minutes Intravenous Every 8 hours 04/14/23 1840 04/16/23 1012   04/14/23 1500  piperacillin -tazobactam (ZOSYN ) IVPB 3.375 g  Status:  Discontinued        3.375 g 12.5 mL/hr over 240 Minutes Intravenous  Once 04/14/23 1450 04/14/23 1838   04/14/23 1443  piperacillin -tazobactam (ZOSYN ) 3.375 GM/50ML IVPB       Note to Pharmacy: Lamonte Pimenta: cabinet override      04/14/23 1443 04/15/23 0259   04/14/23 0600  ceFAZolin  (ANCEF ) IVPB 2g/100 mL premix        2 g 200 mL/hr over 30 Minutes Intravenous On call to O.R. 04/13/23 1701 04/14/23 1309   04/14/23 0600  metroNIDAZOLE  (FLAGYL ) IVPB  500 mg        500 mg 100 mL/hr over 60 Minutes Intravenous On call to O.R. 04/13/23 1701 04/14/23 1329        Assessment/Plan POD 6 s/p s/p total abdominal hysterectomy, bilateral salpingo-oophorectomy, drainage of pelvic abscess cavity, drain placement on 04/14/23 with Dr. Derrel Flies  - Drain per [facility] - Cont abx. Afebrile. WBC normalized. Will discuss with attending duration needed from our standpoint.   Patient had a large pelvic abscess of unknown source with partial distal colonic obstruction.  Large pelvic abscess was drained inta-op by GYN ONC.  This was felt to be the cause of distal colonic obstruction. She is now tolerating a diet and having multiple BM. Would recommend colonoscopy for further workup  if continues to do well. If cessation of bowel fxn, flex sig instead. Please consult GI. She will need follow-up at tertiary care facility  from our perspective.   FEN - Soft VTE - SCDs, Lovenox  ID - Rocephin /Flagyl . Intra-op cx w/ E. Coli   LOS: 10 days    Delton Filbert , [facility] 04/20/2023, 10:22 AM Please see Amion for pager number during day hours 7:00am-4:30pm

## 2023-04-20 NOTE — H&P (View-Only) (Signed)
 Referring Provider: Dr. Nelson Chimes Primary Care Physician:  Jarrett Soho, PA-C Primary Gastroenterologist:  Dr. Bosie Clos  Reason for Consultation:  Abdominal pain; Abnormal imaging  HPI: Tonya Schneider is a 68 y.o. female admitted for management of a large pelvic abscess with partial distal colon obstruction and s/p drainage of pelvic abscess with drain placement and total abdominal hysterectomy and bilateral salpingo-oophorectomy on 04/14/23. GI consult called for inpatient colonoscopy due to large fluid collection tracking to rectum and need to rule out an intraluminal colon source of the fluid and partial colonic obstruction. Denies rectal bleeding. Reports change in her bowel habits since December when she started having alternating diarrhea and constipation. Last colonoscopy in 01/2016 showed medium internal hemorrhoids. Husband in room.  History reviewed. No pertinent past medical history.  Past Surgical History:  Procedure Laterality Date   ABDOMINAL HYSTERECTOMY N/A 04/14/2023   Procedure: TOTAL ABDOMINAL HYSTERECTOMY, BILATERAL SALPINGO OOPHORECTOMY;  Surgeon: Clide Cliff, MD;  Location: [facility];  Service: Gynecology;  Laterality: N/A;   CARPAL TUNNEL RELEASE Bilateral    2010   FRACTURE SURGERY Right    Hip, partial hip replacement   INCISION AND DRAINAGE ABSCESS  04/14/2023   Procedure: ABSCESS DRAINAGE AND DRAIN PLACEMENT;  Surgeon: Clide Cliff, MD;  Location: [facility];  Service: Gynecology;;   OTHER SURGICAL HISTORY     Microdisectomy 1998    Prior to Admission medications   Medication Sig Start Date End Date Taking? Authorizing Provider  ADVIL 200 MG tablet Take 200 mg by mouth in the morning.   Yes [provider]  Cholecalciferol (VITAMIN D3) 1000 units CAPS Take 1,000 Units by mouth at bedtime.   Yes [provider]    Scheduled Meds:  docusate sodium  100 mg Oral BID   enoxaparin (LOVENOX) injection  40 mg Subcutaneous Q24H   feeding  supplement  237 mL Oral BID BM   multivitamin with minerals  1 tablet Oral Daily   polyethylene glycol  17 g Oral BID   [START ON 04/21/2023] polyethylene glycol-electrolytes  4,000 mL Oral Once   senna  2 tablet Oral QHS   Continuous Infusions:  sodium chloride     cefTRIAXone (ROCEPHIN)  IV 2 g (04/20/23 1337)   metronidazole 500 mg (04/20/23 1022)   PRN Meds:.acetaminophen **OR** acetaminophen, guaiFENesin, hydrALAZINE, HYDROmorphone (DILAUDID) injection **OR** HYDROmorphone (DILAUDID) injection, ipratropium-albuterol, lip balm, LORazepam, magnesium hydroxide, metoprolol tartrate, ondansetron **OR** ondansetron (ZOFRAN) IV, oxyCODONE, prochlorperazine, traZODone  Allergies as of 04/09/2023   (Not on File)    Family History  Problem Relation Age of Onset   Breast cancer Mother 64   Ovarian cancer Neg Hx    Endometrial cancer Neg Hx    Colon cancer Neg Hx     Social History   Socioeconomic History   Marital status: Married    Spouse name: Not on file   Number of children: Not on file   Years of education: Not on file   Highest education level: Not on file  Occupational History   Not on file  Tobacco Use   Smoking status: Never   Smokeless tobacco: Not on file  Vaping Use   Vaping status: Never Used  Substance and Sexual Activity   Alcohol use: Never   Drug use: Never   Sexual activity: Yes    Partners: Male  Other Topics Concern   Not on file  Social History Narrative   Not on file   Social Drivers of Health   Financial Resource Strain:  Not on file  Food Insecurity: No Food Insecurity (04/10/2023)   Hunger Vital Sign    Worried About Running Out of Food in the Last Year: Never true    Ran Out of Food in the Last Year: Never true  Transportation Needs: No Transportation Needs (04/10/2023)   PRAPARE - Administrator, Civil Service (Medical): No    Lack of Transportation (Non-Medical): No  Physical Activity: Not on file  Stress: Not on file  Social  Connections: Moderately Integrated (04/10/2023)   Social Connection and Isolation Panel [NHANES]    Frequency of Communication with Friends and Family: Once a week    Frequency of Social Gatherings with Friends and Family: Once a week    Attends Religious Services: More than 4 times per year    Active Member of [employer] or Organizations: Yes    Attends Engineer, structural: More than 4 times per year    Marital Status: Married  Catering manager Violence: Not At Risk (04/10/2023)   Humiliation, Afraid, Rape, and Kick questionnaire    Fear of Current or Ex-Partner: No    Emotionally Abused: No    Physically Abused: No    Sexually Abused: No    Review of Systems: All negative except as stated above in HPI.  Physical Exam: Vital signs: Vitals:   04/19/23 2159 04/20/23 1347  BP: 133/79 119/82  Pulse: 92 89  Resp: 18 18  Temp: 99.3 F (37.4 C) 98 F (36.7 C)  SpO2: 98% 100%   Last BM Date : 04/18/23 General:   Lethargic, thin, no acute distress, pleasant and cooperative Head: normocephalic, atraumatic Eyes: anicteric sclera ENT: oropharynx clear Neck: supple, nontender Lungs:  Clear throughout to auscultation.   No wheezes, crackles, or rhonchi. No acute distress. Heart:  Regular rate and rhythm; no murmurs, clicks, rubs,  or gallops. Abdomen: drain in LLQ with bloody fluid in tubing, distended, diffuse tenderness with guarding, +BS Rectal:  Deferred Ext: no edema  GI:  Lab Results: Recent Labs    04/18/23 0532 04/20/23 0452 04/20/23 0849  WBC 9.8 8.1 8.8  HGB 12.4 11.3* 12.6  HCT 38.5 34.6* 39.7  PLT 383 335 420*   BMET Recent Labs    04/18/23 0532 04/19/23 0523 04/20/23 0452  NA 139 139 137  K 4.0 3.7 3.7  CL 103 104 104  CO2 27 26 26   GLUCOSE 118* 118* 105*  BUN 13 9 11   CREATININE 0.56 0.63 0.63  CALCIUM 8.3* 8.1* 8.0*   LFT Recent Labs    04/20/23 0452  PROT 5.1*  ALBUMIN 2.3*  AST 84*  ALT 69*  ALKPHOS 68  BILITOT 0.6   PT/INR No  results for input(s): "LABPROT", "INR" in the last 72 hours.   Studies/Results: No results found.  Impression/Plan: Recent partial colonic obstruction with pelvic abscess and s/p surgery as stated above in need of an inpt colonoscopy to evaluate for an intraluminal colonic source. Clear liquid diet tomorrow. Colon prep tomorrow afternoon and colonoscopy scheduled for 04/22/23 at 12 pm.    LOS: 10 days   Shirley Friar  04/20/2023, 4:12 PM  Questions please call [phone]

## 2023-04-20 NOTE — Consult Note (Signed)
 Referring Provider: Dr. Nelson Chimes Primary Care Physician:  Jarrett Soho, PA-C Primary Gastroenterologist:  Dr. Bosie Clos  Reason for Consultation:  Abdominal pain; Abnormal imaging  HPI: Tonya Schneider is a 68 y.o. female admitted for management of a large pelvic abscess with partial distal colon obstruction and s/p drainage of pelvic abscess with drain placement and total abdominal hysterectomy and bilateral salpingo-oophorectomy on 04/14/23. GI consult called for inpatient colonoscopy due to large fluid collection tracking to rectum and need to rule out an intraluminal colon source of the fluid and partial colonic obstruction. Denies rectal bleeding. Reports change in her bowel habits since December when she started having alternating diarrhea and constipation. Last colonoscopy in 01/2016 showed medium internal hemorrhoids. Husband in room.  History reviewed. No pertinent past medical history.  Past Surgical History:  Procedure Laterality Date   ABDOMINAL HYSTERECTOMY N/A 04/14/2023   Procedure: TOTAL ABDOMINAL HYSTERECTOMY, BILATERAL SALPINGO OOPHORECTOMY;  Surgeon: Clide Cliff, MD;  Location: [facility];  Service: Gynecology;  Laterality: N/A;   CARPAL TUNNEL RELEASE Bilateral    2010   FRACTURE SURGERY Right    Hip, partial hip replacement   INCISION AND DRAINAGE ABSCESS  04/14/2023   Procedure: ABSCESS DRAINAGE AND DRAIN PLACEMENT;  Surgeon: Clide Cliff, MD;  Location: [facility];  Service: Gynecology;;   OTHER SURGICAL HISTORY     Microdisectomy 1998    Prior to Admission medications   Medication Sig Start Date End Date Taking? Authorizing Provider  ADVIL 200 MG tablet Take 200 mg by mouth in the morning.   Yes [provider]  Cholecalciferol (VITAMIN D3) 1000 units CAPS Take 1,000 Units by mouth at bedtime.   Yes [provider]    Scheduled Meds:  docusate sodium  100 mg Oral BID   enoxaparin (LOVENOX) injection  40 mg Subcutaneous Q24H   feeding  supplement  237 mL Oral BID BM   multivitamin with minerals  1 tablet Oral Daily   polyethylene glycol  17 g Oral BID   [START ON 04/21/2023] polyethylene glycol-electrolytes  4,000 mL Oral Once   senna  2 tablet Oral QHS   Continuous Infusions:  sodium chloride     cefTRIAXone (ROCEPHIN)  IV 2 g (04/20/23 1337)   metronidazole 500 mg (04/20/23 1022)   PRN Meds:.acetaminophen **OR** acetaminophen, guaiFENesin, hydrALAZINE, HYDROmorphone (DILAUDID) injection **OR** HYDROmorphone (DILAUDID) injection, ipratropium-albuterol, lip balm, LORazepam, magnesium hydroxide, metoprolol tartrate, ondansetron **OR** ondansetron (ZOFRAN) IV, oxyCODONE, prochlorperazine, traZODone  Allergies as of 04/09/2023   (Not on File)    Family History  Problem Relation Age of Onset   Breast cancer Mother 64   Ovarian cancer Neg Hx    Endometrial cancer Neg Hx    Colon cancer Neg Hx     Social History   Socioeconomic History   Marital status: Married    Spouse name: Not on file   Number of children: Not on file   Years of education: Not on file   Highest education level: Not on file  Occupational History   Not on file  Tobacco Use   Smoking status: Never   Smokeless tobacco: Not on file  Vaping Use   Vaping status: Never Used  Substance and Sexual Activity   Alcohol use: Never   Drug use: Never   Sexual activity: Yes    Partners: Male  Other Topics Concern   Not on file  Social History Narrative   Not on file   Social Drivers of Health   Financial Resource Strain:  Not on file  Food Insecurity: No Food Insecurity (04/10/2023)   Hunger Vital Sign    Worried About Running Out of Food in the Last Year: Never true    Ran Out of Food in the Last Year: Never true  Transportation Needs: No Transportation Needs (04/10/2023)   PRAPARE - Administrator, Civil Service (Medical): No    Lack of Transportation (Non-Medical): No  Physical Activity: Not on file  Stress: Not on file  Social  Connections: Moderately Integrated (04/10/2023)   Social Connection and Isolation Panel [NHANES]    Frequency of Communication with Friends and Family: Once a week    Frequency of Social Gatherings with Friends and Family: Once a week    Attends Religious Services: More than 4 times per year    Active Member of [employer] or Organizations: Yes    Attends Engineer, structural: More than 4 times per year    Marital Status: Married  Catering manager Violence: Not At Risk (04/10/2023)   Humiliation, Afraid, Rape, and Kick questionnaire    Fear of Current or Ex-Partner: No    Emotionally Abused: No    Physically Abused: No    Sexually Abused: No    Review of Systems: All negative except as stated above in HPI.  Physical Exam: Vital signs: Vitals:   04/19/23 2159 04/20/23 1347  BP: 133/79 119/82  Pulse: 92 89  Resp: 18 18  Temp: 99.3 F (37.4 C) 98 F (36.7 C)  SpO2: 98% 100%   Last BM Date : 04/18/23 General:   Lethargic, thin, no acute distress, pleasant and cooperative Head: normocephalic, atraumatic Eyes: anicteric sclera ENT: oropharynx clear Neck: supple, nontender Lungs:  Clear throughout to auscultation.   No wheezes, crackles, or rhonchi. No acute distress. Heart:  Regular rate and rhythm; no murmurs, clicks, rubs,  or gallops. Abdomen: drain in LLQ with bloody fluid in tubing, distended, diffuse tenderness with guarding, +BS Rectal:  Deferred Ext: no edema  GI:  Lab Results: Recent Labs    04/18/23 0532 04/20/23 0452 04/20/23 0849  WBC 9.8 8.1 8.8  HGB 12.4 11.3* 12.6  HCT 38.5 34.6* 39.7  PLT 383 335 420*   BMET Recent Labs    04/18/23 0532 04/19/23 0523 04/20/23 0452  NA 139 139 137  K 4.0 3.7 3.7  CL 103 104 104  CO2 27 26 26   GLUCOSE 118* 118* 105*  BUN 13 9 11   CREATININE 0.56 0.63 0.63  CALCIUM 8.3* 8.1* 8.0*   LFT Recent Labs    04/20/23 0452  PROT 5.1*  ALBUMIN 2.3*  AST 84*  ALT 69*  ALKPHOS 68  BILITOT 0.6   PT/INR No  results for input(s): "LABPROT", "INR" in the last 72 hours.   Studies/Results: No results found.  Impression/Plan: Recent partial colonic obstruction with pelvic abscess and s/p surgery as stated above in need of an inpt colonoscopy to evaluate for an intraluminal colonic source. Clear liquid diet tomorrow. Colon prep tomorrow afternoon and colonoscopy scheduled for 04/22/23 at 12 pm.    LOS: 10 days   Shirley Friar  04/20/2023, 4:12 PM  Questions please call [phone]

## 2023-04-21 DIAGNOSIS — N739 Female pelvic inflammatory disease, unspecified: Secondary | ICD-10-CM | POA: Diagnosis not present

## 2023-04-21 LAB — PHOSPHORUS: Phosphorus: 3.1 mg/dL (ref 2.5–4.6)

## 2023-04-21 LAB — COMPREHENSIVE METABOLIC PANEL
ALT: 65 U/L — ABNORMAL HIGH (ref 0–44)
AST: 65 U/L — ABNORMAL HIGH (ref 15–41)
Albumin: 2.4 g/dL — ABNORMAL LOW (ref 3.5–5.0)
Alkaline Phosphatase: 67 U/L (ref 38–126)
Anion gap: 10 (ref 5–15)
BUN: 9 mg/dL (ref 8–23)
CO2: 23 mmol/L (ref 22–32)
Calcium: 8 mg/dL — ABNORMAL LOW (ref 8.9–10.3)
Chloride: 106 mmol/L (ref 98–111)
Creatinine, Ser: 0.45 mg/dL (ref 0.44–1.00)
GFR, Estimated: >60 mL/min (ref >60)
Glucose, Bld: 99 mg/dL (ref 70–99)
Potassium: 3.9 mmol/L (ref 3.5–5.1)
Sodium: 139 mmol/L (ref 135–145)
Total Bilirubin: 0.7 mg/dL (ref 0.0–1.2)
Total Protein: 5.1 g/dL — ABNORMAL LOW (ref 6.5–8.1)

## 2023-04-21 LAB — MAGNESIUM: Magnesium: 2.2 mg/dL (ref 1.7–2.4)

## 2023-04-21 NOTE — Plan of Care (Signed)
Problem: Education: Goal: Knowledge of General Education information will improve Description Including pain rating scale, medication(s)/side effects and non-pharmacologic comfort measures Outcome: Progressing   Problem: Health Behavior/Discharge Planning: Goal: Ability to manage health-related needs will improve Outcome: Progressing   Problem: Clinical Measurements: Goal: Respiratory complications will improve Outcome: Progressing   Problem: Activity: Goal: Risk for activity intolerance will decrease Outcome: Progressing   Problem: Nutrition: Goal: Adequate nutrition will be maintained Outcome: Progressing   Problem: Coping: Goal: Level of anxiety will decrease Outcome: Progressing

## 2023-04-21 NOTE — Progress Notes (Signed)
PROGRESS NOTE    Tonya Schneider  WUJ:[id number] DOB: 07-13-1955 DOA: 04/09/2023 PCP: Jarrett Soho, PA-C    Brief Narrative:   68 y.o. female without significant past medical history sent to the hospital for abdominal pain and colon obstruction, she related started about a month prior to admission with having crampy lower abdominal pain with intermittent diarrhea and constipation 3 days prior to admission she started having severe pain and ongoing watery diarrhea.  CT scan of the abdomen pelvis showed large fluid collection about 11 cm, vaginal ultrasound confirmed large cystic mass of the left adnexal region measuring about 11 cm.    Gynecology oncology and general surgery were both consulted.  Dr. Alvester Morin with gynecology performed total abdominal hysterectomy with bilateral salpingo-oophorectomy and drainage of pelvic abscess with drain placement on 2/4.   Patient has since been on IV Zosyn followed by Ceftriaxone and Flagyl with intent to treat with total of 3 weeks of abx.  Post-op course has been complicated by dysmotility however patient has finally begun moving her bowels on several laxatives.    Assessment & Plan:  Principal Problem:   Pelvic abscess in female Active Problems:   Colonic obstruction (HCC)   Hypokalemia   S/P TAH-BSO (total abdominal hysterectomy and bilateral salpingo-oophorectomy)   Pelvic abscess in female Patient is status post operative intervention with TAH/BSO and drainage of pelvic abscess on 2/4 by Dr. Alvester Morin as noted above.  IntraOp cultures growing E. coli, adjust antibiotics accordingly.  Diet as tolerated.  Pain control per general surgery.  Total antibiotic course 3 weeks, EOT 2/25. [facility] planning colonoscopy 2/12   S/P TAH-BSO (total abdominal hysterectomy and bilateral salpingo-oophorectomy) Biopsies negative for malignancy  Colonic obstruction (HCC) Secondary to mass effect, status post operative intervention.  [facility] plans on  colonoscopy 2/12  Hypokalemia replaced   DVT prophylaxis: Lovenox    Code Status: Full Code Family Communication:   Status is: Inpatient Remains inpatient appropriate because: Planned colonoscopy 2/12  Subjective:  Seen at bedside no complaints. Awaiting her colonoscopy tomorrow  Examination:  General exam: Appears calm and comfortable  Respiratory system: Clear to auscultation. Respiratory effort normal. Cardiovascular system: S1 & S2 heard, RRR. No JVD, murmurs, rubs, gallops or clicks. No pedal edema. Gastrointestinal system: Abdomen is nondistended, soft and nontender. No organomegaly or masses felt. Normal bowel sounds heard. Central nervous system: Alert and oriented. No focal neurological deficits. Extremities: Symmetric 5 x 5 power. Skin: No rashes, lesions or ulcers Psychiatry: Judgement and insight appear normal. Mood & affect appropriate.                Diet Orders (From admission, onward)     Start     Ordered   04/22/23 0001  Diet NPO time specified  Diet effective midnight        04/20/23 1612   04/21/23 0500  Diet clear liquid Room service appropriate? Yes; Fluid consistency: Thin  Diet effective 0500 tomorrow       Question Answer Comment  Room service appropriate? Yes   Fluid consistency: Thin      04/20/23 1528            Objective: Vitals:   04/19/23 2159 04/20/23 1347 04/20/23 2131 04/21/23 0529  BP: 133/79 119/82 133/83 122/75  Pulse: 92 89 87 87  Resp: 18 18 16 18   Temp: 99.3 F (37.4 C) 98 F (36.7 C) 97.8 F (36.6 C) 98.3 F (36.8 C)  TempSrc: Oral Oral Oral Oral  SpO2: 98% 100% 100% 99%  Weight:      Height:        Intake/Output Summary (Last 24 hours) at 04/21/2023 1237 Last data filed at 04/21/2023 0931 Gross per 24 hour  Intake 550 ml  Output 50 ml  Net 500 ml   Filed Weights   04/09/23 1636  Weight: 60.8 kg    Scheduled Meds:  docusate sodium  100 mg Oral BID   enoxaparin (LOVENOX) injection  40 mg  Subcutaneous Q24H   feeding supplement  237 mL Oral BID BM   multivitamin with minerals  1 tablet Oral Daily   polyethylene glycol  17 g Oral BID   polyethylene glycol-electrolytes  4,000 mL Oral Once   senna  2 tablet Oral QHS   Continuous Infusions:  sodium chloride     cefTRIAXone (ROCEPHIN)  IV 2 g (04/20/23 1337)   metronidazole 500 mg (04/21/23 1205)    Nutritional status Signs/Symptoms: energy intake < 75% for > 7 days Interventions: Refer to RD note for recommendations, Boost Breeze, MVI Body mass index is 20.08 kg/m.  Data Reviewed:   CBC: Recent Labs  Lab 04/15/23 0457 04/17/23 0509 04/18/23 0532 04/20/23 0452 04/20/23 0849  WBC 18.9* 11.8* 9.8 8.1 8.8  NEUTROABS  --  9.7* 7.7 6.5  --   HGB 13.2 11.8* 12.4 11.3* 12.6  HCT 41.7 36.6 38.5 34.6* 39.7  MCV 89.7 88.8 88.9 87.8 88.8  PLT 445* 373 383 335 420*   Basic Metabolic Panel: Recent Labs  Lab 04/17/23 0509 04/18/23 0532 04/19/23 0523 04/20/23 0452 04/21/23 0513  NA 139 139 139 137 139  K 3.4* 4.0 3.7 3.7 3.9  CL 104 103 104 104 106  CO2 26 27 26 26 23   GLUCOSE 112* 118* 118* 105* 99  BUN 18 13 9 11 9   CREATININE 0.61 0.56 0.63 0.63 0.45  CALCIUM 7.9* 8.3* 8.1* 8.0* 8.0*  MG 2.1 2.2  --  2.3 2.2  PHOS  --   --  3.1  --  3.1   GFR: Estimated Creatinine Clearance: 63.7 mL/min (by C-G formula based on SCr of 0.45 mg/dL). Liver Function Tests: Recent Labs  Lab 04/15/23 1353 04/17/23 0509 04/19/23 0523 04/20/23 0452 04/21/23 0513  AST 14* 20  --  84* 65*  ALT 15 17  --  69* 65*  ALKPHOS 65 56  --  68 67  BILITOT 1.0 0.6  --  0.6 0.7  PROT 6.4* 5.2*  --  5.1* 5.1*  ALBUMIN 3.1* 2.5* 2.5* 2.3* 2.4*   No results for input(s): "LIPASE", "AMYLASE" in the last 168 hours. No results for input(s): "AMMONIA" in the last 168 hours. Coagulation Profile: No results for input(s): "INR", "PROTIME" in the last 168 hours. Cardiac Enzymes: No results for input(s): "CKTOTAL", "CKMB", "CKMBINDEX",  "TROPONINI" in the last 168 hours. BNP (last 3 results) No results for input(s): "PROBNP" in the last 8760 hours. HbA1C: No results for input(s): "HGBA1C" in the last 72 hours. CBG: No results for input(s): "GLUCAP" in the last 168 hours. Lipid Profile: No results for input(s): "CHOL", "HDL", "LDLCALC", "TRIG", "CHOLHDL", "LDLDIRECT" in the last 72 hours. Thyroid Function Tests: No results for input(s): "TSH", "T4TOTAL", "FREET4", "T3FREE", "THYROIDAB" in the last 72 hours. Anemia Panel: No results for input(s): "VITAMINB12", "FOLATE", "FERRITIN", "TIBC", "IRON", "RETICCTPCT" in the last 72 hours. Sepsis Labs: No results for input(s): "PROCALCITON", "LATICACIDVEN" in the last 168 hours.  Recent Results (from the past 240 hours)  Surgical pcr  screen     Status: Abnormal   Collection Time: 04/13/23  8:32 PM   Specimen: Nasal Mucosa; Nasal Swab  Result Value Ref Range Status   MRSA, PCR NEGATIVE NEGATIVE Final   Staphylococcus aureus POSITIVE (A) NEGATIVE Final    Comment: (NOTE) The Xpert SA Assay (FDA approved for NASAL specimens in patients 52 years of age and older), is one component of a comprehensive surveillance program. It is not intended to diagnose infection nor to guide or monitor treatment. Performed at [facility], [address], [place], [place] [address]   Aerobic/Anaerobic Culture w Gram Stain (surgical/deep wound)     Status: None   Collection Time: 04/14/23  2:42 PM   Specimen: PATH Soft tissue  Result Value Ref Range Status   Specimen Description TISSUE ABSCESS WALL  Final   Special Requests NONE  Final   Gram Stain   Final    FEW WBC PRESENT,BOTH PMN AND MONONUCLEAR FEW GRAM NEGATIVE RODS    Culture   Final    ABUNDANT ESCHERICHIA COLI FEW PHOCAEICOLA VULGATUS BETA LACTAMASE POSITIVE    Report Status 04/17/2023 FINAL  Final   Organism ID, Bacteria ESCHERICHIA COLI  Final   Organism ID, Bacteria ESCHERICHIA COLI  Final       Susceptibility   Escherichia coli - MIC*    AMPICILLIN >=32 RESISTANT Resistant     CEFEPIME <=0.12 SENSITIVE Sensitive     CEFTAZIDIME <=1 SENSITIVE Sensitive     CEFTRIAXONE <=0.25 SENSITIVE Sensitive     CIPROFLOXACIN <=0.25 SENSITIVE Sensitive     GENTAMICIN <=1 SENSITIVE Sensitive     IMIPENEM <=0.25 SENSITIVE Sensitive     TRIMETH/SULFA <=20 SENSITIVE Sensitive     AMPICILLIN/SULBACTAM 16 INTERMEDIATE Intermediate     PIP/TAZO <=4 SENSITIVE Sensitive ug/mL   Escherichia coli - KIRBY BAUER*    CEFAZOLIN INTERMEDIATE Intermediate     * ABUNDANT ESCHERICHIA COLI    ABUNDANT ESCHERICHIA COLI         Radiology Studies: No results found.         LOS: 11 days   Time spent= 35 mins    Miguel Rota, MD [facility]  If 7PM-7AM, please contact night-coverage  04/21/2023, 12:37 PM

## 2023-04-21 NOTE — Progress Notes (Addendum)
7 Days Post-Op  Subjective: CC: Stable R sided abdominal pain that is well controlled. GI has seen and plans colonoscopy tomorrow. Back down to [facility] currently for prep. Tolerating without n/v. BM yesterday. Mobilizing. Voiding.   Objective: Vital signs in last 24 hours: Temp:  [97.8 F (36.6 C)-98.3 F (36.8 C)] 98.3 F (36.8 C) (02/11 0529) Pulse Rate:  [87-89] 87 (02/11 0529) Resp:  [16-18] 18 (02/11 0529) BP: (119-133)/(75-83) 122/75 (02/11 0529) SpO2:  [99 %-100 %] 99 % (02/11 0529) Last BM Date : 04/18/23  Intake/Output from previous day: 02/10 0701 - 02/11 0700 In: 490 [P.O.:390; IV Piggyback:100] Out: 50 [Drains:50] Intake/Output this shift: Total I/O In: 120 [P.O.:120] Out: -   PE: Gen:  Alert, NAD, pleasant Abd: Soft, mild distension, some R sided ttp, appropriately ttp around incision - otherwise NT. No rigidity or guarding. Midline wound cdi, JP drain SS - 50cc/24 hours.   Lab Results:  Recent Labs    04/20/23 0452 04/20/23 0849  WBC 8.1 8.8  HGB 11.3* 12.6  HCT 34.6* 39.7  PLT 335 420*   BMET Recent Labs    04/20/23 0452 04/21/23 0513  NA 137 139  K 3.7 3.9  CL 104 106  CO2 26 23  GLUCOSE 105* 99  BUN 11 9  CREATININE 0.63 0.45  CALCIUM 8.0* 8.0*   PT/INR No results for input(s): "LABPROT", "INR" in the last 72 hours. CMP     Component Value Date/Time   NA 139 04/21/2023 0513   K 3.9 04/21/2023 0513   CL 106 04/21/2023 0513   CO2 23 04/21/2023 0513   GLUCOSE 99 04/21/2023 0513   BUN 9 04/21/2023 0513   CREATININE 0.45 04/21/2023 0513   CALCIUM 8.0 (L) 04/21/2023 0513   PROT 5.1 (L) 04/21/2023 0513   ALBUMIN 2.4 (L) 04/21/2023 0513   AST 65 (H) 04/21/2023 0513   ALT 65 (H) 04/21/2023 0513   ALKPHOS 67 04/21/2023 0513   BILITOT 0.7 04/21/2023 0513   GFRNONAA >60 04/21/2023 0513   Lipase     Component Value Date/Time   LIPASE <10 (L) 04/09/2023 1642    Studies/Results: No results  found.  Anti-infectives: Anti-infectives (From admission, onward)    Start     Dose/Rate Route Frequency Ordered Stop   04/16/23 1400  cefTRIAXone (ROCEPHIN) 2 g in sodium chloride 0.9 % 100 mL IVPB        2 g 200 mL/hr over 30 Minutes Intravenous Every 24 hours 04/16/23 1012     04/16/23 1100  metroNIDAZOLE (FLAGYL) IVPB 500 mg        500 mg 100 mL/hr over 60 Minutes Intravenous Every 12 hours 04/16/23 1012     04/14/23 2300  piperacillin-tazobactam (ZOSYN) IVPB 3.375 g  Status:  Discontinued        3.375 g 12.5 mL/hr over 240 Minutes Intravenous Every 8 hours 04/14/23 1840 04/16/23 1012   04/14/23 1500  piperacillin-tazobactam (ZOSYN) IVPB 3.375 g  Status:  Discontinued        3.375 g 12.5 mL/hr over 240 Minutes Intravenous  Once 04/14/23 1450 04/14/23 1838   04/14/23 1443  piperacillin-tazobactam (ZOSYN) 3.375 GM/50ML IVPB       Note to Pharmacy: Linard Millers: cabinet override      04/14/23 1443 04/15/23 0259   04/14/23 0600  ceFAZolin (ANCEF) IVPB 2g/100 mL premix        2 g 200 mL/hr over 30 Minutes Intravenous On call to O.R. 04/13/23 1701 04/14/23  1309   04/14/23 0600  metroNIDAZOLE (FLAGYL) IVPB 500 mg        500 mg 100 mL/hr over 60 Minutes Intravenous On call to O.R. 04/13/23 1701 04/14/23 1329        Assessment/Plan POD 7 s/p s/p total abdominal hysterectomy, bilateral salpingo-oophorectomy, drainage of pelvic abscess cavity, drain placement on 04/14/23 with Dr. Clide Cliff  - Drain per [facility] They plan to pull drain on day of d/c.  - Cont abx. Afebrile. WBC normalized. Will discuss with attending duration needed from our standpoint.   Patient had a large pelvic abscess of unknown source with partial distal colonic obstruction.  Large pelvic abscess was drained inta-op by GYN ONC.  This was felt to be the cause of distal colonic obstruction. Post op she was tolerating a diet and having multiple BM. After discussion with colorectal - recommend GI consult for  colonoscopy. GI has seen and plans scope tomorrow. She will need follow-up at tertiary care facility from our perspective.   FEN - [facility]D VTE - SCDs, Lovenox ID - Rocephin/Flagyl. Intra-op cx w/ E. Coli   LOS: 11 days    Jacinto Halim , [facility] 04/21/2023, 8:47 AM Please see Amion for pager number during day hours 7:00am-4:30pm

## 2023-04-21 NOTE — Progress Notes (Signed)
Nutrition Follow-up  DOCUMENTATION CODES:   Not applicable  INTERVENTION:   Advance diet as medically applicable.  - Boost Breeze po BID, each supplement provides 250 kcal and 9 grams of protein             - Once diet advanced further, recommend change to Ensure Plus High Protein po BID, each supplement provides 350 kcal and 20 grams of protein. - Multivitamin with minerals daily - Monitor weight trends.     NUTRITION DIAGNOSIS:   Inadequate oral intake related to acute illness (pelvic mass with colonic obstruction) as evidenced by energy intake < 75% for > 7 days.    GOAL:   Patient will meet greater than or equal to 90% of their needs    MONITOR:   PO intake, Supplement acceptance, Diet advancement, Weight trends  REASON FOR ASSESSMENT:   Consult Assessment of nutrition requirement/status, Other (Comment) ("Post-op patient, partial obstruction preop from pelvic collection, last adequate meal per pt over one week ago.")  ASSESSMENT:   68 y.o. female with no significant PMH who presented  with abdominal pain and admitted for pelvic mass and colonic obstruction. Patient setting up in bed, alert and orientated. She stated that she is getting an appetite back.  Stating that she really enjoyed her lunch yesterday, but ate so much of it that she was no hungry for dinner. She further reported that she tolerated it well with no abdominal pain.   Admit weight: 60.8 Kg    Average Meal Intake: 75-100: 87% intake x 2 recorded meals  Nutritionally Relevant Medications: Scheduled Meds:  docusate sodium  100 mg Oral BID   feeding supplement  237 mL Oral BID BM   multivitamin with minerals  1 tablet Oral Daily   senna  2 tablet Oral QHS    Labs Reviewed    NUTRITION - FOCUSED PHYSICAL EXAM:    Diet Order:   Diet Order             Diet NPO time specified  Diet effective midnight           Diet clear liquid Room service appropriate? Yes; Fluid consistency: Thin   Diet effective 0500 tomorrow                   EDUCATION NEEDS:   Education needs have been addressed  Skin:  Skin Assessment: Skin Integrity Issues: Skin Integrity Issues:: Other (Comment) Other: Abdomen  Last BM:  2/2  Height:   Ht Readings from Last 1 Encounters:  04/11/23 5' 8.5" (1.74 m)    Weight:   Wt Readings from Last 1 Encounters:  04/09/23 60.8 kg    Ideal Body Weight:     BMI:  Body mass index is 20.08 kg/m.  Estimated Nutritional Needs:   Kcal:  1700-1950 kcals  Protein:  80-100 grams  Fluid:  >/= 1.7L    Jamelle Haring RDN, LDN Clinical Dietitian   If unable to reach, please contact "RD Inpatient" secure chat group between 8 am-4 pm daily"

## 2023-04-21 NOTE — Progress Notes (Signed)
Inpatient Gyn Onc Progress Note  Subjective: Pt reports doing well. No nausea or emesis reported. Has decreased appetite. Has passed some flatus and had small BM overnight, more substantial amount yesterday. Continues to ambulate in the halls. Intermittent abdominal pain that is manageable. Has had some burning with urination yesterday and today. Surgical drain has been leaking around insertion site. No fever-like symptoms. No concerns voiced.   Objective: Vital signs in last 24 hours: Temp:  [97.8 F (36.6 C)-98.3 F (36.8 C)] 98.3 F (36.8 C) (02/11 0529) Pulse Rate:  [87-89] 87 (02/11 0529) Resp:  [16-18] 18 (02/11 0529) BP: (119-133)/(75-83) 122/75 (02/11 0529) SpO2:  [99 %-100 %] 99 % (02/11 0529) Last BM Date : 04/18/23  Intake/Output from previous day: 02/10 0701 - 02/11 0700 In: 490 [P.O.:390; IV Piggyback:100] Out: 50 [Drains:50]  Physical Examination (performed by Dr. Alvester Morin): General: Alert, oriented, no acute distress. HEENT: Normocephalic, atraumatic. Sclera anicteric.  Chest: Normal work of breathing. Lungs clear. Heart regular in rate and rhythm. Abdomen: Mildly distended, rounded, mildly tympanitic, soft. Appropriately tender. Hypoactive bowel sounds. Abdominal midline incision with dermabond with no drainage or erythema. Surgical drain with serous output. Dressing around drain saturated. Dressing changed. No erythema around insertion site. No excessive drainage noted from insertion site. Extremities: Warm, well perfused.  No edema bilaterally.   Labs:   BUN/Cr/glu/ALT/AST/amyl/lip:  9/0.45/--/65/65/--/-- (02/11 0513) CEA: 14.8 (elevated) CA125: 24.6 (normal) Abundant E coli on abscess wall taken intra-op Path has returned from procedure on 04/14/23: A.   UTERUS, CERVIX, BILATERAL FALLOPIAN TUBES AND OVARIES: Cervix     Nabothian cyst     Negative for dysplasia Endometrium     Atrophic     Benign endometrial polyps     Negative for endometrial intraepithelial  neoplasia (EIN) and malignancy Myometrium     Unremarkable     Negative for malignancy Right ovary     Benign mucinous cyst, 3 mm     Negative for endometriosis and malignancy Left ovary     Mild inflammation with reactive serosal mesothelial changes and mild serositis     Negative for endometriosis and malignancy Right fallopian tube     Unremarkable     Negative for endometriosis and malignancy Left fallopian tube     Focal mild nonspecific inflammation     Negative for endometriosis and malignancy  B.   PERINEUM CUL DE SAC, POSTERIOR, EXCISION: Granulation tissue with abundant fibrin and hemorrhage Negative for endometriosis and malignancy   Assessment/Plan: 68 y.o. s/p total abdominal hysterectomy, bilateral salpingo-oophorectomy, drainage of pelvic abscess cavity, drain placement on 04/14/23 with Dr. Clide Cliff. Stable post-operatively. GI consultation completed with plans for colonoscopy inpatient tomorrow. Surgical drain to be removed at discharge. Last CBC on 04/20/23 with WBC in normal range. Continue current plan of care. Currently on rocephin and flagyl IV-can be transitioned to oral per Dr. Alvester Morin.     LOS: 11 days    Tonya Schneider 04/21/2023, 10:41 AM

## 2023-04-21 NOTE — Anesthesia Preprocedure Evaluation (Signed)
Anesthesia Evaluation  Patient identified by MRN, date of birth, ID band Patient awake    Reviewed: Allergy & Precautions, NPO status , Patient's Chart, lab work & pertinent test results  Airway Mallampati: III  TM Distance: >3 FB Neck ROM: Full    Dental no notable dental hx. (+) Teeth Intact, Dental Advisory Given   Pulmonary neg pulmonary ROS   Pulmonary exam normal breath sounds clear to auscultation       Cardiovascular negative cardio ROS Normal cardiovascular exam Rhythm:Regular Rate:Normal     Neuro/Psych negative neurological ROS  negative psych ROS   GI/Hepatic Neg liver ROS,,,Pelvic abscess- s/p TAH/BSO and drainage of pelvic abscess on 2/4 by Dr. Alvester Morin as noted above.  IntraOp cultures growing E. coli, adjust antibiotics accordingly. Biopsies negative for malignancy.   Colonic obstruction 2/2 mass effect, status post operative intervention    Endo/Other  negative endocrine ROS    Renal/GU negative Renal ROS  negative genitourinary   Musculoskeletal negative musculoskeletal ROS (+)    Abdominal   Peds  Hematology negative hematology ROS (+)   Anesthesia Other Findings   Reproductive/Obstetrics negative OB ROS                             Anesthesia Physical Anesthesia Plan  ASA: 3  Anesthesia Plan: MAC   Post-op Pain Management:    Induction:   PONV Risk Score and Plan: 2 and Propofol infusion and TIVA  Airway Management Planned: Natural Airway and Simple Face Mask  Additional Equipment: None  Intra-op Plan:   Post-operative Plan:   Informed Consent: I have reviewed the patients History and Physical, chart, labs and discussed the procedure including the risks, benefits and alternatives for the proposed anesthesia with the patient or authorized representative who has indicated his/her understanding and acceptance.       Plan Discussed with:  CRNA  Anesthesia Plan Comments:        Anesthesia Quick Evaluation

## 2023-04-22 ENCOUNTER — Inpatient Hospital Stay (HOSPITAL_COMMUNITY): Payer: Self-pay | Admitting: Anesthesiology

## 2023-04-22 ENCOUNTER — Other Ambulatory Visit: Payer: Self-pay

## 2023-04-22 ENCOUNTER — Other Ambulatory Visit (HOSPITAL_COMMUNITY): Payer: Self-pay

## 2023-04-22 ENCOUNTER — Encounter (HOSPITAL_COMMUNITY)
Admission: EM | Disposition: A | Discharge status: Home / Self Care | Payer: Self-pay | Source: Home / Self Care | Attending: Internal Medicine

## 2023-04-22 ENCOUNTER — Encounter (HOSPITAL_COMMUNITY): Payer: Self-pay | Admitting: Internal Medicine

## 2023-04-22 ENCOUNTER — Inpatient Hospital Stay (HOSPITAL_COMMUNITY): Payer: Medicare HMO | Admitting: Anesthesiology

## 2023-04-22 DIAGNOSIS — K6289 Other specified diseases of anus and rectum: Secondary | ICD-10-CM | POA: Diagnosis not present

## 2023-04-22 DIAGNOSIS — K626 Ulcer of anus and rectum: Secondary | ICD-10-CM | POA: Diagnosis not present

## 2023-04-22 DIAGNOSIS — N739 Female pelvic inflammatory disease, unspecified: Secondary | ICD-10-CM | POA: Diagnosis not present

## 2023-04-22 DIAGNOSIS — K640 First degree hemorrhoids: Secondary | ICD-10-CM | POA: Diagnosis not present

## 2023-04-22 HISTORY — PX: COLONOSCOPY WITH PROPOFOL: SHX5780

## 2023-04-22 HISTORY — PX: BIOPSY: SHX5522

## 2023-04-22 LAB — COMPREHENSIVE METABOLIC PANEL
ALT: 82 U/L — ABNORMAL HIGH (ref 0–44)
AST: 76 U/L — ABNORMAL HIGH (ref 15–41)
Albumin: 2.7 g/dL — ABNORMAL LOW (ref 3.5–5.0)
Alkaline Phosphatase: 78 U/L (ref 38–126)
Anion gap: 9 (ref 5–15)
BUN: 7 mg/dL — ABNORMAL LOW (ref 8–23)
CO2: 22 mmol/L (ref 22–32)
Calcium: 7.7 mg/dL — ABNORMAL LOW (ref 8.9–10.3)
Chloride: 102 mmol/L (ref 98–111)
Creatinine, Ser: 0.59 mg/dL (ref 0.44–1.00)
GFR, Estimated: >60 mL/min (ref >60)
Glucose, Bld: 88 mg/dL (ref 70–99)
Potassium: 3.8 mmol/L (ref 3.5–5.1)
Sodium: 133 mmol/L — ABNORMAL LOW (ref 135–145)
Total Bilirubin: 0.6 mg/dL (ref 0.0–1.2)
Total Protein: 5.6 g/dL — ABNORMAL LOW (ref 6.5–8.1)

## 2023-04-22 LAB — URINE CULTURE: Culture: NO GROWTH

## 2023-04-22 LAB — MAGNESIUM: Magnesium: 2.3 mg/dL (ref 1.7–2.4)

## 2023-04-22 SURGERY — COLONOSCOPY WITH PROPOFOL
Anesthesia: Monitor Anesthesia Care

## 2023-04-22 MED ORDER — SODIUM CHLORIDE 0.9 % IV SOLN: INTRAVENOUS | Status: DC | PRN

## 2023-04-22 MED ORDER — PROPOFOL 1000 MG/100ML IV EMUL
INTRAVENOUS | Status: AC
  Filled 2023-04-22: qty 100

## 2023-04-22 MED ORDER — PROPOFOL 10 MG/ML IV BOLUS
INTRAVENOUS | Status: DC | PRN
  Administered 2023-04-22: 40 mg via INTRAVENOUS

## 2023-04-22 MED ORDER — METRONIDAZOLE 500 MG PO TABS
500.0000 mg | ORAL_TABLET | Freq: Two times a day (BID) | ORAL | 0 refills | Status: AC
  Filled 2023-04-22: qty 26, 13d supply, fill #0

## 2023-04-22 MED ORDER — PROPOFOL 500 MG/50ML IV EMUL
INTRAVENOUS | Status: DC | PRN
  Administered 2023-04-22: 80 ug/kg/min via INTRAVENOUS

## 2023-04-22 MED ORDER — LIDOCAINE HCL (CARDIAC) PF 100 MG/5ML IV SOSY
PREFILLED_SYRINGE | INTRAVENOUS | Status: DC | PRN
  Administered 2023-04-22: 40 mg via INTRATRACHEAL

## 2023-04-22 MED ORDER — OXYCODONE HCL 5 MG PO TABS
2.5000 mg | ORAL_TABLET | ORAL | 0 refills | Status: DC | PRN
  Filled 2023-04-22: qty 30, 5d supply, fill #0

## 2023-04-22 MED ORDER — POLYETHYLENE GLYCOL 3350 17 GM/SCOOP PO POWD
17.0000 g | Freq: Every day | ORAL | 0 refills | Status: AC | PRN
  Filled 2023-04-22: qty 14, 14d supply, fill #0
  Filled 2023-04-22: qty 238, 14d supply, fill #0

## 2023-04-22 MED ORDER — METRONIDAZOLE 500 MG PO TABS
500.0000 mg | ORAL_TABLET | Freq: Two times a day (BID) | ORAL | Status: DC
  Administered 2023-04-22: 500 mg via ORAL
  Filled 2023-04-22: qty 1

## 2023-04-22 MED ORDER — CIPROFLOXACIN HCL 500 MG PO TABS
500.0000 mg | ORAL_TABLET | Freq: Two times a day (BID) | ORAL | 0 refills | Status: AC
  Filled 2023-04-22: qty 26, 13d supply, fill #0

## 2023-04-22 MED ORDER — DOCUSATE SODIUM 100 MG PO CAPS
200.0000 mg | ORAL_CAPSULE | Freq: Two times a day (BID) | ORAL | 0 refills | Status: AC | PRN
  Filled 2023-04-22: qty 60, 15d supply, fill #0

## 2023-04-22 MED ORDER — OXYCODONE-ACETAMINOPHEN 5-325 MG PO TABS
1.0000 | ORAL_TABLET | ORAL | 0 refills | Status: DC | PRN
  Filled 2023-04-22: qty 30, 5d supply, fill #0

## 2023-04-22 MED ORDER — CIPROFLOXACIN HCL 500 MG PO TABS
500.0000 mg | ORAL_TABLET | Freq: Two times a day (BID) | ORAL | Status: DC
  Administered 2023-04-22: 500 mg via ORAL
  Filled 2023-04-22: qty 1

## 2023-04-22 SURGICAL SUPPLY — 20 items

## 2023-04-22 NOTE — Discharge Summary (Signed)
Physician Discharge Summary  RUDEAN ICENHOUR UEA:[id number] DOB: 03-Dec-1955 DOA: 04/09/2023  PCP: Jarrett Soho, PA-C  Admit date: 04/09/2023 Discharge date: 04/22/2023  Admitted From: Home Disposition: Home  Recommendations for Outpatient Follow-up:  Follow up with PCP in 1-2 weeks Please obtain BMP/CBC in one week your next doctors visit.  Cipro Flagyl orally until May 05, 2023 Eventually will need another outpatient colonoscopy with better prep Outpatient follow-up with GYN oncology   Discharge Condition: Stable CODE STATUS: Full code Diet recommendation: Regular  Brief/Interim Summary: Brief Narrative:   68 y.o. female without significant past medical history sent to the hospital for abdominal pain and colon obstruction, she related started about a month prior to admission with having crampy lower abdominal pain with intermittent diarrhea and constipation 3 days prior to admission she started having severe pain and ongoing watery diarrhea.  CT scan of the abdomen pelvis showed large fluid collection about 11 cm, vaginal ultrasound confirmed large cystic mass of the left adnexal region measuring about 11 cm.    Gynecology oncology and general surgery were both consulted.  Dr. Alvester Morin with gynecology performed total abdominal hysterectomy with bilateral salpingo-oophorectomy and drainage of pelvic abscess with drain placement on 2/4.   Patient has since been on IV Zosyn followed by Ceftriaxone and Flagyl with intent to treat with total of 3 weeks of abx.  Post-op course has been complicated by dysmotility however patient has finally begun moving her bowels on several laxatives.    Eventually patient underwent colonoscopy on 2/12.  Overall was a poor prep but biopsies were taken and internal hemorrhoids were noted.  Cleared by GI for discharge.  Will transition to p.o. antibiotics.  Drain will likely be pulled by OB/GYN providers prior to discharge.  Assessment & Plan:   Principal Problem:   Pelvic abscess in female Active Problems:   Colonic obstruction (HCC)   Hypokalemia   S/P TAH-BSO (total abdominal hysterectomy and bilateral salpingo-oophorectomy)   Pelvic abscess in female Patient is status post operative intervention with TAH/BSO and drainage of pelvic abscess on 2/4 by Dr. Alvester Morin as noted above.  IntraOp cultures growing E. coli, adjust antibiotics accordingly.  Diet as tolerated.  Pain control per general surgery.  Total antibiotic course 3 weeks, EOT 2/25. Will change to PO Cipro flagyl after C scope.  [facility] planning colonoscopy 2/12   S/P TAH-BSO (total abdominal hysterectomy and bilateral salpingo-oophorectomy) Biopsies negative for malignancy.  Seen by GYN oncology, will pull out the drain prior to her discharge  Colonic obstruction (HCC) Secondary to mass effect, status post operative intervention.  [facility] performed colonoscopy 2/12.  Results as above.  Hypokalemia replaced   DVT prophylaxis: Lovenox    Code Status: Full Code Family Communication:   Status is: Inpatient Remains inpatient appropriate because: Discharge today  Subjective: No complaints doing well.  Examination:  General exam: Appears calm and comfortable  Respiratory system: Clear to auscultation. Respiratory effort normal. Cardiovascular system: S1 & S2 heard, RRR. No JVD, murmurs, rubs, gallops or clicks. No pedal edema. Gastrointestinal system: Abdomen is nondistended, soft and nontender. No organomegaly or masses felt. Normal bowel sounds heard. Central nervous system: Alert and oriented. No focal neurological deficits. Extremities: Symmetric 5 x 5 power. Skin: No rashes, lesions or ulcers Psychiatry: Judgement and insight appear normal. Mood & affect appropriate.    Discharge Diagnoses:  Principal Problem:   Pelvic abscess in female Active Problems:   Colonic obstruction (HCC)   Hypokalemia   S/P TAH-BSO (  total abdominal hysterectomy and  bilateral salpingo-oophorectomy)      Discharge Exam: Vitals:   04/22/23 1353 04/22/23 1407  BP: 132/85 (!) 140/84  Pulse:  84  Resp: 16   Temp:  97.6 F (36.4 C)  SpO2:  100%   Vitals:   04/22/23 1320 04/22/23 1330 04/22/23 1353 04/22/23 1407  BP: 136/83 (!) 150/72 132/85 (!) 140/84  Pulse: 89 75  84  Resp: (!) 22 17 16    Temp:    97.6 F (36.4 C)  TempSrc:    Oral  SpO2: 100% 100%  100%  Weight:      Height:          Discharge Instructions  Discharge Instructions     meds to beds pharmacy consult ([facility] ONLY)   Complete by: As directed       Allergies as of 04/22/2023       Reactions   Alendronate Other (See Comments)   Heartburn        Medication List     STOP taking these medications    Advil 200 MG tablet Generic drug: ibuprofen   Vitamin D3 1000 units Caps       TAKE these medications    ciprofloxacin 500 MG tablet Commonly known as: CIPRO Take 1 tablet (500 mg total) by mouth 2 (two) times daily for 13 days.   docusate sodium 100 MG capsule Commonly known as: COLACE Take 2 capsules (200 mg total) by mouth 2 (two) times daily as needed for mild constipation.   metroNIDAZOLE 500 MG tablet Commonly known as: FLAGYL Take 1 tablet (500 mg total) by mouth 2 (two) times daily for 13 days.   oxyCODONE 5 MG immediate release tablet Commonly known as: Oxy IR/ROXICODONE Take 0.5-1 tablets (2.5-5 mg total) by mouth every 4 (four) hours as needed for moderate pain (pain score 4-6), severe pain (pain score 7-10) or breakthrough pain (2.5 mg for 4-6/10 pain. 5 mg for >6/10 pain).   polyethylene glycol 17 g packet Commonly known as: MIRALAX / GLYCOLAX Take 17 g by mouth daily as needed for severe constipation.        Follow-up Information     Clide Cliff, MD Follow up on 05/04/2023.   Specialty: Gynecologic Oncology Why: at 3:45pm [facility]. There is valet parking free of  charge. Contact information: [address] [place] [address] [phone]         Jarrett Soho, PA-C Follow up in 1 week(s).   Specialty: Family Medicine Contact information: [address] [place] [address] [phone]                Allergies  Allergen Reactions   Alendronate Other (See Comments)    Heartburn    You were cared for by a hospitalist during your hospital stay. If you have any questions about your discharge medications or the care you received while you were in the hospital after you are discharged, you can call the unit and asked to speak with the hospitalist on call if the hospitalist that took care of you is not available. Once you are discharged, your primary care physician will handle any further medical issues. Please note that no refills for any discharge medications will be authorized once you are discharged, as it is imperative that you return to your primary care physician (or establish a relationship with a primary care physician if you do not have one) for your aftercare  needs so that they can reassess your need for medications and monitor your lab values.  You were cared for by a hospitalist during your hospital stay. If you have any questions about your discharge medications or the care you received while you were in the hospital after you are discharged, you can call the unit and asked to speak with the hospitalist on call if the hospitalist that took care of you is not available. Once you are discharged, your primary care physician will handle any further medical issues. Please note that NO REFILLS for any discharge medications will be authorized once you are discharged, as it is imperative that you return to your primary care physician (or establish a relationship with a primary care physician if you do not have one) for your aftercare needs so that they can reassess your need for medications and monitor your lab values.  Please  request your Prim.MD to go over all Hospital Tests and Procedure/Radiological results at the follow up, please get all Hospital records sent to your Prim MD by signing hospital release before you go home.  Get CBC, CMP, 2 view Chest X ray checked  by Primary MD during your next visit or SNF MD in 5-7 days ( we routinely change or add medications that can affect your baseline labs and fluid status, therefore we recommend that you get the mentioned basic workup next visit with your PCP, your PCP may decide not to get them or add new tests based on their clinical decision)  On your next visit with your primary care physician please Get Medicines reviewed and adjusted.  If you experience worsening of your admission symptoms, develop shortness of breath, life threatening emergency, suicidal or homicidal thoughts you must seek medical attention immediately by calling 911 or calling your MD immediately  if symptoms less severe.  You Must read complete instructions/literature along with all the possible adverse reactions/side effects for all the Medicines you take and that have been prescribed to you. Take any new Medicines after you have completely understood and accpet all the possible adverse reactions/side effects.   Do not drive, operate heavy machinery, perform activities at heights, swimming or participation in water activities or provide baby sitting services if your were admitted for syncope or siezures until you have seen by Primary MD or a Neurologist and advised to do so again.  Do not drive when taking Pain medications.   Procedures/Studies: MR PELVIS W WO CONTRAST Result Date: 04/12/2023 CLINICAL DATA:  Cystic lesion within the pelvis on CT and ultrasound. EXAM: MRI PELVIS WITHOUT AND WITH CONTRAST TECHNIQUE: Multiplanar multisequence MR imaging of the pelvis was performed both before and after administration of intravenous contrast. CONTRAST:  6mL GADAVIST GADOBUTROL 1 MMOL/ML IV SOLN  COMPARISON:  04/09/2023 CT and ultrasound FINDINGS: Exam is moderate to markedly limited secondary to susceptibility artifact from right hip arthroplasty as well as the extent of colonic gas and stool, causing further susceptibility artifact. Urinary Tract: Urinary bladder is collapsed around a Foley catheter. Lower pole right renal 1.5 cm cyst . In the absence of clinically indicated signs/symptoms require(s) no independent follow-up. Bowel: The low rectum and anus are compressed by the central pelvic process. The upstream colon is dilated and stool-filled. Vascular/Lymphatic: Limited evaluation for pelvic aneurysm or sidewall adenopathy. None present on recent CT. Reproductive: The uterus is likely visualized eccentric left and 15/13. Ovaries not visualized. A complex deep central pelvic floor collection measures 6.8 x 6.8 by 9.8 cm. Example 21/3 and  18/6. Position and shape favor dilated, fluid-filled vagina. No well-defined obstructive mass. Post-contrast images demonstrate no significant enhancement within this collection. Example 45/17. Other:  None. Musculoskeletal: Right hip arthroplasty. IMPRESSION: 1. Moderate to markedly degraded exam secondary to right hip arthroplasty and the extent of gas and stool. 2. Complex collection within the deep central pelvis, favored to represent hydrocolpos. Consider physical exam correlation. 3. Compressed low rectum and anus by the pelvic collection. Upstream dilated colon suggests a component of obstruction. Electronically Signed   By: Jeronimo Greaves M.D.   On: 04/12/2023 16:05   DG Abd Portable 1V Result Date: 04/12/2023 CLINICAL DATA:  Nasogastric tube placement EXAM: PORTABLE ABDOMEN - 1 VIEW COMPARISON:  None Available. FINDINGS: Nasogastric tube tip overlies the proximal body of the stomach. Moderate stool noted within the visualized colon. Pelvis excluded from view. IMPRESSION: 1. Nasogastric tube tip within the proximal body of the stomach. Electronically Signed    By: Helyn Numbers M.D.   On: 04/12/2023 04:13   DG Abd Portable 1 View Addendum Date: 04/10/2023 ADDENDUM REPORT: 04/09/2023 23:58 ADDENDUM: Additional image was provided after readjustment. Nasogastric tube tip is now in the proximal body of the stomach. Electronically Signed   By: Darliss Cheney M.D.   On: 04/09/2023 23:58   Result Date: 04/09/2023 CLINICAL DATA:  NG tube EXAM: PORTABLE ABDOMEN - 1 VIEW COMPARISON:  Abdominal x-ray 08/03/2009 FINDINGS: NG tube tip is in the proximal stomach with sidehole at the level of the gastroesophageal junction. There is gaseous distention of bowel in the upper abdomen. Visualized lungs are clear. IMPRESSION: NG tube tip is in the proximal stomach with sidehole at the level of the gastroesophageal junction. Recommend advancing tube 7-8 cm. Electronically Signed: By: Darliss Cheney M.D. On: 04/09/2023 22:49   US PELVIC TRANSABD W/PELVIC DOPPLER Result Date: 04/09/2023 CLINICAL DATA:  Cystic structure seen on CT EXAM: TRANSABDOMINAL ULTRASOUND OF PELVIS DOPPLER ULTRASOUND OF OVARIES TECHNIQUE: Transabdominal ultrasound examination of the pelvis was performed including evaluation of the uterus, ovaries, adnexal regions, and pelvic cul-de-sac. Color and duplex Doppler ultrasound was utilized to evaluate blood flow to the ovaries. COMPARISON:  CT 04/09/2023, 08/22/2009 FINDINGS: Uterus Measurements: 6.4 x 3.4 x 4.2 cm = volume: 49 mL. No fibroids or other mass visualized. Uterus is anteriorly displaced by pelvic mass. Endometrium Thickness: 6.6 mm.  No focal abnormality visualized. Right ovary Measurements: 1.1 x 3.0 x 1.5 cm = volume: 3 mL. Normal appearance/no adnexal mass. Left ovary Not definitively visualized. Large cystic mass in the posterior pelvis/left adnexal region measuring 10.8 x 11.5 x 10.4 cm. This cystic mass is thick-walled with homogeneous low level internal echoes. No vascularity was evident internally or within the walls of the cystic mass. Pulsed  Doppler evaluation demonstrates normal low-resistance arterial and venous waveforms in the right ovary. Other: No free fluid is seen within the pelvis. IMPRESSION: 1. Large cystic mass in the posterior pelvis/left adnexal region measuring up to 11.5 cm. This cystic mass is thick-walled with low level internal echoes. No internal vascularity was evident. Findings are most suspicious for cystic neoplasm. Gynecology consultation is recommended. 2. The left ovary was not definitively visualized. 3. Normal sonographic appearance of the uterus and right ovary. Electronically Signed   By: Duanne Guess D.O.   On: 04/09/2023 20:29   CT ABDOMEN PELVIS W CONTRAST Result Date: 04/09/2023 CLINICAL DATA:  Abdominal pain and fever. Nausea vomiting and diarrhea. EXAM: CT ABDOMEN AND PELVIS WITH CONTRAST TECHNIQUE: Multidetector CT imaging of the abdomen  and pelvis was performed using the standard protocol following bolus administration of intravenous contrast. RADIATION DOSE REDUCTION: This exam was performed according to the departmental dose-optimization program which includes automated exposure control, adjustment of the mA and/or kV according to patient size and/or use of iterative reconstruction technique. CONTRAST:  85mL OMNIPAQUE IOHEXOL 300 MG/ML  SOLN COMPARISON:  CT abdomen pelvis dated 08/22/2009. FINDINGS: Lower chest: The visualized lung bases are clear. No intra-abdominal free air or free fluid. Hepatobiliary: The liver is unremarkable. No biliary ductal dilatation. The gallbladder is unremarkable. Pancreas: Unremarkable. No pancreatic ductal dilatation or surrounding inflammatory changes. Spleen: Normal in size without focal abnormality. Adrenals/Urinary Tract: The adrenal glands are unremarkable. Small right renal inferior pole cyst. There is no hydronephrosis on either side. There is symmetric enhancement of the kidneys. The urinary bladder is minimally distended and grossly unremarkable. Stomach/Bowel:  There is large amount of stool throughout the colon. There is distension of the rectal vault and sigmoid colon with large amount of stool measuring up to 10 cm in diameter. No evidence of small-bowel obstruction. The appendix is not visualized with certainty. No inflammatory changes identified in the right lower quadrant. Vascular/Lymphatic: Mild aortoiliac atherosclerotic disease. The IVC is unremarkable. No portal venous gas. There is no adenopathy. Reproductive: The uterus is anteverted and poorly visualized. There is a 9 x 11 x 13 cm fluid collection in the pelvis which is not evaluated but may represent a fluid distended vagina or cervical region. Clinical correlation and further evaluation with pelvic ultrasound recommended. Other: Loss of subcutaneous and intra-abdominal fat and cachexia. Musculoskeletal: Osteopenia with multilevel degenerative changes. Right hip arthroplasty. No acute osseous pathology. IMPRESSION: 1. Large fluid collection or cystic structure within the pelvis may represent hydrocolpos. Clinical correlation and further evaluation with pelvic ultrasound recommended. 2. Distended rectal vault and sigmoid colon with large stool burden. Findings may represent a colonic dysmotility or a degree of obstruction related to compression of the rectum by the pelvic cystic structure. No evidence of small-bowel obstruction. 3.  Aortic Atherosclerosis (ICD10-I70.0). Electronically Signed   By: Elgie Collard M.D.   On: 04/09/2023 18:52     The results of significant diagnostics from this hospitalization (including imaging, microbiology, ancillary and laboratory) are listed below for reference.     Microbiology: Recent Results (from the past 240 hours)  Surgical pcr screen     Status: Abnormal   Collection Time: 04/13/23  8:32 PM   Specimen: Nasal Mucosa; Nasal Swab  Result Value Ref Range Status   MRSA, PCR NEGATIVE NEGATIVE Final   Staphylococcus aureus POSITIVE (A) NEGATIVE Final     Comment: (NOTE) The Xpert SA Assay (FDA approved for NASAL specimens in patients 74 years of age and older), is one component of a comprehensive surveillance program. It is not intended to diagnose infection nor to guide or monitor treatment. Performed at [facility], [address], [place], [place] [address]   Aerobic/Anaerobic Culture w Gram Stain (surgical/deep wound)     Status: None   Collection Time: 04/14/23  2:42 PM   Specimen: PATH Soft tissue  Result Value Ref Range Status   Specimen Description TISSUE ABSCESS WALL  Final   Special Requests NONE  Final   Gram Stain   Final    FEW WBC PRESENT,BOTH PMN AND MONONUCLEAR FEW GRAM NEGATIVE RODS    Culture   Final    ABUNDANT ESCHERICHIA COLI FEW PHOCAEICOLA VULGATUS BETA LACTAMASE POSITIVE    Report Status 04/17/2023 FINAL  Final   Organism ID, Bacteria ESCHERICHIA COLI  Final   Organism ID, Bacteria ESCHERICHIA COLI  Final      Susceptibility   Escherichia coli - MIC*    AMPICILLIN >=32 RESISTANT Resistant     CEFEPIME <=0.12 SENSITIVE Sensitive     CEFTAZIDIME <=1 SENSITIVE Sensitive     CEFTRIAXONE <=0.25 SENSITIVE Sensitive     CIPROFLOXACIN <=0.25 SENSITIVE Sensitive     GENTAMICIN <=1 SENSITIVE Sensitive     IMIPENEM <=0.25 SENSITIVE Sensitive     TRIMETH/SULFA <=20 SENSITIVE Sensitive     AMPICILLIN/SULBACTAM 16 INTERMEDIATE Intermediate     PIP/TAZO <=4 SENSITIVE Sensitive ug/mL   Escherichia coli - KIRBY BAUER*    CEFAZOLIN INTERMEDIATE Intermediate     * ABUNDANT ESCHERICHIA COLI    ABUNDANT ESCHERICHIA COLI  Urine Culture (for pregnant, neutropenic or urologic patients or patients with an indwelling urinary catheter)     Status: None   Collection Time: 04/21/23  1:18 PM   Specimen: Urine, Clean Catch  Result Value Ref Range Status   Specimen Description   Final    URINE, CLEAN CATCH Performed at [facility], [address], [place], [place] [address]    Special  Requests   Final    NONE Performed at [facility], [address] [place], [place] [address]    Culture   Final    NO GROWTH Performed at [facility], [address] [place], [place] [address]    Report Status 04/22/2023 FINAL  Final     Labs: BNP (last 3 results) No results for input(s): "BNP" in the last 8760 hours. Basic Metabolic Panel: Recent Labs  Lab 04/17/23 0509 04/18/23 0532 04/19/23 0523 04/20/23 0452 04/21/23 0513 04/22/23 0510  NA 139 139 139 137 139 133*  K 3.4* 4.0 3.7 3.7 3.9 3.8  CL 104 103 104 104 106 102  CO2 26 27 26 26 23 22   GLUCOSE 112* 118* 118* 105* 99 88  BUN 18 13 9 11 9  7*  CREATININE 0.61 0.56 0.63 0.63 0.45 0.59  CALCIUM 7.9* 8.3* 8.1* 8.0* 8.0* 7.7*  MG 2.1 2.2  --  2.3 2.2 2.3  PHOS  --   --  3.1  --  3.1  --    Liver Function Tests: Recent Labs  Lab 04/17/23 0509 04/19/23 0523 04/20/23 0452 04/21/23 0513 04/22/23 0510  AST 20  --  84* 65* 76*  ALT 17  --  69* 65* 82*  ALKPHOS 56  --  68 67 78  BILITOT 0.6  --  0.6 0.7 0.6  PROT 5.2*  --  5.1* 5.1* 5.6*  ALBUMIN 2.5* 2.5* 2.3* 2.4* 2.7*   No results for input(s): "LIPASE", "AMYLASE" in the last 168 hours. No results for input(s): "AMMONIA" in the last 168 hours. CBC: Recent Labs  Lab 04/17/23 0509 04/18/23 0532 04/20/23 0452 04/20/23 0849  WBC 11.8* 9.8 8.1 8.8  NEUTROABS 9.7* 7.7 6.5  --   HGB 11.8* 12.4 11.3* 12.6  HCT 36.6 38.5 34.6* 39.7  MCV 88.8 88.9 87.8 88.8  PLT 373 383 335 420*   Cardiac Enzymes: No results for input(s): "CKTOTAL", "CKMB", "CKMBINDEX", "TROPONINI" in the last 168 hours. BNP: Invalid input(s): "POCBNP" CBG: No results for input(s): "GLUCAP" in the last 168 hours. D-Dimer No results for input(s): "DDIMER" in the last 72 hours. Hgb A1c No results for input(s): "HGBA1C" in the last 72 hours. Lipid Profile No results for input(s): "  CHOL", "HDL", "LDLCALC", "TRIG", "CHOLHDL", "LDLDIRECT" in the last 72  hours. Thyroid function studies No results for input(s): "TSH", "T4TOTAL", "T3FREE", "THYROIDAB" in the last 72 hours.  Invalid input(s): "FREET3" Anemia work up No results for input(s): "VITAMINB12", "FOLATE", "FERRITIN", "TIBC", "IRON", "RETICCTPCT" in the last 72 hours. Urinalysis    Component Value Date/Time   COLORURINE YELLOW 04/09/2023 1642   APPEARANCEUR CLEAR 04/09/2023 1642   LABSPEC >1.046 (H) 04/09/2023 1642   PHURINE 6.0 04/09/2023 1642   GLUCOSEU NEGATIVE 04/09/2023 1642   HGBUR SMALL (A) 04/09/2023 1642   BILIRUBINUR NEGATIVE 04/09/2023 1642   KETONESUR NEGATIVE 04/09/2023 1642   PROTEINUR 100 (A) 04/09/2023 1642   NITRITE NEGATIVE 04/09/2023 1642   LEUKOCYTESUR NEGATIVE 04/09/2023 1642   Sepsis Labs Recent Labs  Lab 04/17/23 0509 04/18/23 0532 04/20/23 0452 04/20/23 0849  WBC 11.8* 9.8 8.1 8.8   Microbiology Recent Results (from the past 240 hours)  Surgical pcr screen     Status: Abnormal   Collection Time: 04/13/23  8:32 PM   Specimen: Nasal Mucosa; Nasal Swab  Result Value Ref Range Status   MRSA, PCR NEGATIVE NEGATIVE Final   Staphylococcus aureus POSITIVE (A) NEGATIVE Final    Comment: (NOTE) The Xpert SA Assay (FDA approved for NASAL specimens in patients 17 years of age and older), is one component of a comprehensive surveillance program. It is not intended to diagnose infection nor to guide or monitor treatment. Performed at [facility], [address], [place], [place] [address]   Aerobic/Anaerobic Culture w Gram Stain (surgical/deep wound)     Status: None   Collection Time: 04/14/23  2:42 PM   Specimen: PATH Soft tissue  Result Value Ref Range Status   Specimen Description TISSUE ABSCESS WALL  Final   Special Requests NONE  Final   Gram Stain   Final    FEW WBC PRESENT,BOTH PMN AND MONONUCLEAR FEW GRAM NEGATIVE RODS    Culture   Final    ABUNDANT ESCHERICHIA COLI FEW PHOCAEICOLA VULGATUS BETA LACTAMASE  POSITIVE    Report Status 04/17/2023 FINAL  Final   Organism ID, Bacteria ESCHERICHIA COLI  Final   Organism ID, Bacteria ESCHERICHIA COLI  Final      Susceptibility   Escherichia coli - MIC*    AMPICILLIN >=32 RESISTANT Resistant     CEFEPIME <=0.12 SENSITIVE Sensitive     CEFTAZIDIME <=1 SENSITIVE Sensitive     CEFTRIAXONE <=0.25 SENSITIVE Sensitive     CIPROFLOXACIN <=0.25 SENSITIVE Sensitive     GENTAMICIN <=1 SENSITIVE Sensitive     IMIPENEM <=0.25 SENSITIVE Sensitive     TRIMETH/SULFA <=20 SENSITIVE Sensitive     AMPICILLIN/SULBACTAM 16 INTERMEDIATE Intermediate     PIP/TAZO <=4 SENSITIVE Sensitive ug/mL   Escherichia coli - KIRBY BAUER*    CEFAZOLIN INTERMEDIATE Intermediate     * ABUNDANT ESCHERICHIA COLI    ABUNDANT ESCHERICHIA COLI  Urine Culture (for pregnant, neutropenic or urologic patients or patients with an indwelling urinary catheter)     Status: None   Collection Time: 04/21/23  1:18 PM   Specimen: Urine, Clean Catch  Result Value Ref Range Status   Specimen Description   Final    URINE, CLEAN CATCH Performed at [facility], [address], [place], [place] [address]    Special Requests   Final    NONE Performed at [facility], [address], [place], [place] [address]    Culture   Final  NO GROWTH Performed at [facility], [address], [place], [place] [address]    Report Status 04/22/2023 FINAL  Final     Time coordinating discharge:  I have spent 35 minutes face to face with the patient and on the ward discussing the patients care, assessment, plan and disposition with other care givers. >50% of the time was devoted counseling the patient about the risks and benefits of treatment/Discharge disposition and coordinating care.   SIGNED:   Miguel Rota, MD  [facility] 04/22/2023, 2:23 PM   If 7PM-7AM, please contact night-coverage

## 2023-04-22 NOTE — Discharge Instructions (Addendum)
AFTER SURGERY INSTRUCTIONS  Return to work: 4-6 weeks if applicable  You can place a dry dressing over the surgical drain site. When it is not draining, you can use a bandaid to cover this. The opening should heal from the inside out. Call for any redness, pus-like drainage. You can shower over this area and then reapply a dry dressing. It is fine if the opening gets wet but no submerging yourself in water like a tub bath.  Activity: 1. Be up and out of the bed during the day.  Take a nap if needed.  You may walk up steps but be careful and use the hand rail.  Stair climbing will tire you more than you think, you may need to stop part way and rest.   2. No lifting or straining for 6 weeks over 10 pounds. No pushing, pulling, straining for 6 weeks.  3. No driving for 1-61 days when the following criteria have been met: Do not drive if you are taking narcotic pain medicine and make sure that your reaction time has returned.   4. You can shower as soon as the next day after surgery. Shower daily.  Use your regular soap and water (not directly on the incision) and pat your incision(s) dry afterwards; don't rub.  No tub baths or submerging your body in water until cleared by your surgeon.  5. No sexual activity and nothing in the vagina for 12 weeks. You have an incision at the top of the vagina with dissolvable stitches.  6. You may experience a small amount of clear drainage from your incision, which is normal.  If the drainage persists, increases, or changes color please call the office.  7. Do not use creams, lotions, or ointments such as neosporin on your incisions after surgery until advised by your surgeon because they can cause removal of the dermabond glue on your incisions.    8. You may experience vaginal spotting after surgery or when the stitches at the top of the vagina begin to dissolve.  The spotting is normal but if you experience heavy bleeding, call our office.  9. Take Tylenol  or ibuprofen first for pain if you are able to take these medications.  Monitor your Tylenol intake to a max of 4,000 mg in a 24 hour period. You can alternate these medications after surgery.  Diet: 1. Low sodium Heart Healthy Diet is recommended but you are cleared to resume your normal (before surgery) diet after your procedure.  2. It is safe to use a laxative, such as Miralax or Colace, if you have difficulty moving your bowels before surgery. You will need to be on a daily regimen such as miralax once or twice daily to keep your bowels moving.  Wound Care: 1. Keep clean and dry.  Shower daily.  Reasons to call the Doctor: Fever - Oral temperature greater than 100.4 degrees Fahrenheit Foul-smelling vaginal discharge Difficulty urinating Nausea and vomiting Increased pain at the site of the incision that is unrelieved with pain medicine. Difficulty breathing with or without chest pain New calf pain especially if only on one side Sudden, continuing increased vaginal bleeding with or without clots.   Contacts: For questions or concerns you should contact:  Dr. Clide Cliff at [phone]  Warner Mccreedy, NP at [phone]  After Hours: call [phone] and have the GYN Oncologist paged/contacted (after 5 pm or on the weekends). You will speak with an after hours RN and let he or she know  you have had surgery.  Messages sent via mychart are for non-urgent matters and are not responded to after hours so for urgent needs, please call the after hours number.

## 2023-04-22 NOTE — Interval H&P Note (Signed)
History and Physical Interval Note:  04/22/2023 12:24 PM  Tonya Schneider  has presented today for surgery, with the diagnosis of Abdominal pain; Abnormal imaging.  The various methods of treatment have been discussed with the patient and family. After consideration of risks, benefits and other options for treatment, the patient has consented to  Procedure(s): COLONOSCOPY WITH PROPOFOL (N/A) as a surgical intervention.  The patient's history has been reviewed, patient examined, no change in status, stable for surgery.  I have reviewed the patient's chart and labs.  Questions were answered to the patient's satisfaction.     Shirley Friar

## 2023-04-22 NOTE — Progress Notes (Signed)
8 Days Post-Op  Subjective: CC: Seen with attending.  Reports she tolerated prep for colonoscopy with no abdominal pain, n/v. BM yesterday.   Afebrile. No tachycardia or hypotension. WBC wnl on last check.   Objective: Vital signs in last 24 hours: Temp:  [97.9 F (36.6 C)-98.9 F (37.2 C)] 98.3 F (36.8 C) (02/12 0505) Pulse Rate:  [88-93] 91 (02/12 0505) Resp:  [15-16] 15 (02/12 0505) BP: (116-141)/(77-95) 136/86 (02/12 0505) SpO2:  [97 %-100 %] 97 % (02/12 0505) Last BM Date : 04/22/23  Intake/Output from previous day: 02/11 0701 - 02/12 0700 In: 1214.8 [P.O.:1080; IV Piggyback:134.8] Out: 865 [Urine:800; Drains:65] Intake/Output this shift: No intake/output data recorded.  PE: Gen:  Alert, NAD, pleasant Abd: Soft, mild distension, some R sided ttp, appropriately ttp around incision - otherwise NT. No rigidity or guarding. Midline wound cdi, JP drain serous - 65cc/24 hours.   Lab Results:  Recent Labs    04/20/23 0452 04/20/23 0849  WBC 8.1 8.8  HGB 11.3* 12.6  HCT 34.6* 39.7  PLT 335 420*   BMET Recent Labs    04/21/23 0513 04/22/23 0510  NA 139 133*  K 3.9 3.8  CL 106 102  CO2 23 22  GLUCOSE 99 88  BUN 9 7*  CREATININE 0.45 0.59  CALCIUM 8.0* 7.7*   PT/INR No results for input(s): "LABPROT", "INR" in the last 72 hours. CMP     Component Value Date/Time   NA 133 (L) 04/22/2023 0510   K 3.8 04/22/2023 0510   CL 102 04/22/2023 0510   CO2 22 04/22/2023 0510   GLUCOSE 88 04/22/2023 0510   BUN 7 (L) 04/22/2023 0510   CREATININE 0.59 04/22/2023 0510   CALCIUM 7.7 (L) 04/22/2023 0510   PROT 5.6 (L) 04/22/2023 0510   ALBUMIN 2.7 (L) 04/22/2023 0510   AST 76 (H) 04/22/2023 0510   ALT 82 (H) 04/22/2023 0510   ALKPHOS 78 04/22/2023 0510   BILITOT 0.6 04/22/2023 0510   GFRNONAA >60 04/22/2023 0510   Lipase     Component Value Date/Time   LIPASE <10 (L) 04/09/2023 1642    Studies/Results: No results  found.  Anti-infectives: Anti-infectives (From admission, onward)    Start     Dose/Rate Route Frequency Ordered Stop   04/16/23 1400  cefTRIAXone (ROCEPHIN) 2 g in sodium chloride 0.9 % 100 mL IVPB        2 g 200 mL/hr over 30 Minutes Intravenous Every 24 hours 04/16/23 1012     04/16/23 1100  metroNIDAZOLE (FLAGYL) IVPB 500 mg        500 mg 100 mL/hr over 60 Minutes Intravenous Every 12 hours 04/16/23 1012     04/14/23 2300  piperacillin-tazobactam (ZOSYN) IVPB 3.375 g  Status:  Discontinued        3.375 g 12.5 mL/hr over 240 Minutes Intravenous Every 8 hours 04/14/23 1840 04/16/23 1012   04/14/23 1500  piperacillin-tazobactam (ZOSYN) IVPB 3.375 g  Status:  Discontinued        3.375 g 12.5 mL/hr over 240 Minutes Intravenous  Once 04/14/23 1450 04/14/23 1838   04/14/23 1443  piperacillin-tazobactam (ZOSYN) 3.375 GM/50ML IVPB       Note to Pharmacy: Linard Millers: cabinet override      04/14/23 1443 04/15/23 0259   04/14/23 0600  ceFAZolin (ANCEF) IVPB 2g/100 mL premix        2 g 200 mL/hr over 30 Minutes Intravenous On call to O.R. 04/13/23 1701 04/14/23 1309  04/14/23 0600  metroNIDAZOLE (FLAGYL) IVPB 500 mg        500 mg 100 mL/hr over 60 Minutes Intravenous On call to O.R. 04/13/23 1701 04/14/23 1329        Assessment/Plan POD 8 s/p s/p total abdominal hysterectomy, bilateral salpingo-oophorectomy, drainage of pelvic abscess cavity, drain placement on 04/14/23 with Dr. Clide Cliff  - Drain per [facility]. They plan to pull drain on day of d/c.  - Cont abx. Afebrile. WBC normalized. Will discuss with attending duration needed from our standpoint.   Patient had a large pelvic abscess of unknown source with partial distal colonic obstruction.  Large pelvic abscess was drained inta-op by GYN ONC.  This was felt to be the cause of distal colonic obstruction. Post op she was tolerating a diet and having multiple BM. After discussion with colorectal - recommend GI consult for  colonoscopy. GI has seen and plans scope today. She will need follow-up at tertiary care facility from our perspective.   FEN - NPO for colonoscopy. Okay for diet post op from our standpoint.  VTE - SCDs, Lovenox ID - Rocephin/Flagyl. Intra-op cx w/ E. Coli   LOS: 12 days    Jacinto Halim , [facility] 04/22/2023, 9:15 AM Please see Amion for pager number during day hours 7:00am-4:30pm

## 2023-04-22 NOTE — Anesthesia Postprocedure Evaluation (Signed)
Anesthesia Post Note  Patient: Tonya Schneider  Procedure(s) Performed: COLONOSCOPY WITH PROPOFOL BIOPSY     Patient location during evaluation: PACU Anesthesia Type: MAC Level of consciousness: awake and alert, oriented and patient cooperative Pain management: pain level controlled Vital Signs Assessment: post-procedure vital signs reviewed and stable Respiratory status: spontaneous breathing, nonlabored ventilation and respiratory function stable Cardiovascular status: blood pressure returned to baseline and stable Postop Assessment: no apparent nausea or vomiting Anesthetic complications: no   No notable events documented.  Last Vitals:  Vitals:   04/22/23 1353 04/22/23 1407  BP: 132/85 (!) 140/84  Pulse:  84  Resp: 16   Temp:  36.4 C  SpO2:  100%    Last Pain:  Vitals:   04/22/23 1407  TempSrc: Oral  PainSc:                  Lannie Fields

## 2023-04-22 NOTE — Transfer of Care (Signed)
Immediate Anesthesia Transfer of Care Note  Patient: Tonya Schneider  Procedure(s) Performed: COLONOSCOPY WITH PROPOFOL BIOPSY  Patient Location: PACU  Anesthesia Type:MAC  Level of Consciousness: drowsy  Airway & Oxygen Therapy: Patient Spontanous Breathing and Patient connected to face mask oxygen  Post-op Assessment: Report given to RN and Post -op Vital signs reviewed and stable  Post vital signs: Reviewed and stable  Last Vitals:  Vitals Value Taken Time  BP 125/80 04/22/23 1310  Temp    Pulse 92 04/22/23 1312  Resp 20 04/22/23 1312  SpO2 100 % 04/22/23 1312  Vitals shown include unfiled device data.  Last Pain:  Vitals:   04/22/23 1100  TempSrc: Temporal  PainSc: 0-No pain      Patients Stated Pain Goal: 2 (04/22/23 0754)  Complications: No notable events documented.

## 2023-04-22 NOTE — Progress Notes (Signed)
Patient was DC'd home. All medications were delivered to patients room and DC instructions were reviewed with patient. All questions were answered and patient was taken to main entrance via wheelchair.

## 2023-04-22 NOTE — Progress Notes (Signed)
PROGRESS NOTE    Tonya Schneider  ZOX:[id number] DOB: February 07, 1956 DOA: 04/09/2023 PCP: Jarrett Soho, PA-C    Brief Narrative:   68 y.o. female without significant past medical history sent to the hospital for abdominal pain and colon obstruction, she related started about a month prior to admission with having crampy lower abdominal pain with intermittent diarrhea and constipation 3 days prior to admission she started having severe pain and ongoing watery diarrhea.  CT scan of the abdomen pelvis showed large fluid collection about 11 cm, vaginal ultrasound confirmed large cystic mass of the left adnexal region measuring about 11 cm.    Gynecology oncology and general surgery were both consulted.  Dr. Alvester Morin with gynecology performed total abdominal hysterectomy with bilateral salpingo-oophorectomy and drainage of pelvic abscess with drain placement on 2/4.   Patient has since been on IV Zosyn followed by Ceftriaxone and Flagyl with intent to treat with total of 3 weeks of abx.  Post-op course has been complicated by dysmotility however patient has finally begun moving her bowels on several laxatives.    Assessment & Plan:  Principal Problem:   Pelvic abscess in female Active Problems:   Colonic obstruction (HCC)   Hypokalemia   S/P TAH-BSO (total abdominal hysterectomy and bilateral salpingo-oophorectomy)   Pelvic abscess in female Patient is status post operative intervention with TAH/BSO and drainage of pelvic abscess on 2/4 by Dr. Alvester Morin as noted above.  IntraOp cultures growing E. coli, adjust antibiotics accordingly.  Diet as tolerated.  Pain control per general surgery.  Total antibiotic course 3 weeks, EOT 2/25. Will change to PO Cipro flagyl after C scope.  [facility] planning colonoscopy 2/12   S/P TAH-BSO (total abdominal hysterectomy and bilateral salpingo-oophorectomy) Biopsies negative for malignancy.  Seen by GYN oncology, will pull out the drain prior to her  discharge  Colonic obstruction (HCC) Secondary to mass effect, status post operative intervention.  [facility] plans on colonoscopy 2/12  Hypokalemia replaced   DVT prophylaxis: Lovenox    Code Status: Full Code Family Communication:   Status is: Inpatient Remains inpatient appropriate because: Planned colonoscopy 2/12.  May be discharged later today  Subjective: No complaints doing well.  Examination:  General exam: Appears calm and comfortable  Respiratory system: Clear to auscultation. Respiratory effort normal. Cardiovascular system: S1 & S2 heard, RRR. No JVD, murmurs, rubs, gallops or clicks. No pedal edema. Gastrointestinal system: Abdomen is nondistended, soft and nontender. No organomegaly or masses felt. Normal bowel sounds heard. Central nervous system: Alert and oriented. No focal neurological deficits. Extremities: Symmetric 5 x 5 power. Skin: No rashes, lesions or ulcers Psychiatry: Judgement and insight appear normal. Mood & affect appropriate.                Diet Orders (From admission, onward)     Start     Ordered   04/22/23 0001  Diet NPO time specified  Diet effective midnight        04/20/23 1612            Objective: Vitals:   04/21/23 1321 04/21/23 2043 04/22/23 0505 04/22/23 1100  BP: 116/77 (!) 141/95 136/86 132/74  Pulse: 93 88 91 90  Resp: 16 15 15 14   Temp: 98.9 F (37.2 C) 97.9 F (36.6 C) 98.3 F (36.8 C) 98 F (36.7 C)  TempSrc: Oral Oral Oral Temporal  SpO2: 98% 100% 97% 100%  Weight:      Height:  Intake/Output Summary (Last 24 hours) at 04/22/2023 1155 Last data filed at 04/22/2023 0819 Gross per 24 hour  Intake 1034.81 ml  Output 845 ml  Net 189.81 ml   Filed Weights   04/09/23 1636  Weight: 60.8 kg    Scheduled Meds:  [MAR Hold] docusate sodium  100 mg Oral BID   [MAR Hold] enoxaparin (LOVENOX) injection  40 mg Subcutaneous Q24H   [MAR Hold] feeding supplement  237 mL Oral BID BM   [MAR Hold]  multivitamin with minerals  1 tablet Oral Daily   [MAR Hold] polyethylene glycol  17 g Oral BID   [MAR Hold] senna  2 tablet Oral QHS   Continuous Infusions:  [MAR Hold] cefTRIAXone (ROCEPHIN)  IV 2 g (04/21/23 1410)   [MAR Hold] metronidazole 500 mg (04/21/23 2139)    Nutritional status Signs/Symptoms: energy intake < 75% for > 7 days Interventions: Refer to RD note for recommendations, Boost Breeze, MVI Body mass index is 20.08 kg/m.  Data Reviewed:   CBC: Recent Labs  Lab 04/17/23 0509 04/18/23 0532 04/20/23 0452 04/20/23 0849  WBC 11.8* 9.8 8.1 8.8  NEUTROABS 9.7* 7.7 6.5  --   HGB 11.8* 12.4 11.3* 12.6  HCT 36.6 38.5 34.6* 39.7  MCV 88.8 88.9 87.8 88.8  PLT 373 383 335 420*   Basic Metabolic Panel: Recent Labs  Lab 04/17/23 0509 04/18/23 0532 04/19/23 0523 04/20/23 0452 04/21/23 0513 04/22/23 0510  NA 139 139 139 137 139 133*  K 3.4* 4.0 3.7 3.7 3.9 3.8  CL 104 103 104 104 106 102  CO2 26 27 26 26 23 22   GLUCOSE 112* 118* 118* 105* 99 88  BUN 18 13 9 11 9  7*  CREATININE 0.61 0.56 0.63 0.63 0.45 0.59  CALCIUM 7.9* 8.3* 8.1* 8.0* 8.0* 7.7*  MG 2.1 2.2  --  2.3 2.2 2.3  PHOS  --   --  3.1  --  3.1  --    GFR: Estimated Creatinine Clearance: 63.7 mL/min (by C-G formula based on SCr of 0.59 mg/dL). Liver Function Tests: Recent Labs  Lab 04/15/23 1353 04/17/23 0509 04/19/23 0523 04/20/23 0452 04/21/23 0513 04/22/23 0510  AST 14* 20  --  84* 65* 76*  ALT 15 17  --  69* 65* 82*  ALKPHOS 65 56  --  68 67 78  BILITOT 1.0 0.6  --  0.6 0.7 0.6  PROT 6.4* 5.2*  --  5.1* 5.1* 5.6*  ALBUMIN 3.1* 2.5* 2.5* 2.3* 2.4* 2.7*   No results for input(s): "LIPASE", "AMYLASE" in the last 168 hours. No results for input(s): "AMMONIA" in the last 168 hours. Coagulation Profile: No results for input(s): "INR", "PROTIME" in the last 168 hours. Cardiac Enzymes: No results for input(s): "CKTOTAL", "CKMB", "CKMBINDEX", "TROPONINI" in the last 168 hours. BNP (last 3  results) No results for input(s): "PROBNP" in the last 8760 hours. HbA1C: No results for input(s): "HGBA1C" in the last 72 hours. CBG: No results for input(s): "GLUCAP" in the last 168 hours. Lipid Profile: No results for input(s): "CHOL", "HDL", "LDLCALC", "TRIG", "CHOLHDL", "LDLDIRECT" in the last 72 hours. Thyroid Function Tests: No results for input(s): "TSH", "T4TOTAL", "FREET4", "T3FREE", "THYROIDAB" in the last 72 hours. Anemia Panel: No results for input(s): "VITAMINB12", "FOLATE", "FERRITIN", "TIBC", "IRON", "RETICCTPCT" in the last 72 hours. Sepsis Labs: No results for input(s): "PROCALCITON", "LATICACIDVEN" in the last 168 hours.  Recent Results (from the past 240 hours)  Surgical pcr screen     Status: Abnormal  Collection Time: 04/13/23  8:32 PM   Specimen: Nasal Mucosa; Nasal Swab  Result Value Ref Range Status   MRSA, PCR NEGATIVE NEGATIVE Final   Staphylococcus aureus POSITIVE (A) NEGATIVE Final    Comment: (NOTE) The Xpert SA Assay (FDA approved for NASAL specimens in patients 14 years of age and older), is one component of a comprehensive surveillance program. It is not intended to diagnose infection nor to guide or monitor treatment. Performed at [facility], [address] [place], [place] [address]   Aerobic/Anaerobic Culture w Gram Stain (surgical/deep wound)     Status: None   Collection Time: 04/14/23  2:42 PM   Specimen: PATH Soft tissue  Result Value Ref Range Status   Specimen Description TISSUE ABSCESS WALL  Final   Special Requests NONE  Final   Gram Stain   Final    FEW WBC PRESENT,BOTH PMN AND MONONUCLEAR FEW GRAM NEGATIVE RODS    Culture   Final    ABUNDANT ESCHERICHIA COLI FEW PHOCAEICOLA VULGATUS BETA LACTAMASE POSITIVE    Report Status 04/17/2023 FINAL  Final   Organism ID, Bacteria ESCHERICHIA COLI  Final   Organism ID, Bacteria ESCHERICHIA COLI  Final      Susceptibility   Escherichia coli - MIC*     AMPICILLIN >=32 RESISTANT Resistant     CEFEPIME <=0.12 SENSITIVE Sensitive     CEFTAZIDIME <=1 SENSITIVE Sensitive     CEFTRIAXONE <=0.25 SENSITIVE Sensitive     CIPROFLOXACIN <=0.25 SENSITIVE Sensitive     GENTAMICIN <=1 SENSITIVE Sensitive     IMIPENEM <=0.25 SENSITIVE Sensitive     TRIMETH/SULFA <=20 SENSITIVE Sensitive     AMPICILLIN/SULBACTAM 16 INTERMEDIATE Intermediate     PIP/TAZO <=4 SENSITIVE Sensitive ug/mL   Escherichia coli - KIRBY BAUER*    CEFAZOLIN INTERMEDIATE Intermediate     * ABUNDANT ESCHERICHIA COLI    ABUNDANT ESCHERICHIA COLI         Radiology Studies: No results found.         LOS: 12 days   Time spent= 35 mins    Miguel Rota, MD [facility]  If 7PM-7AM, please contact night-coverage  04/22/2023, 11:55 AM

## 2023-04-22 NOTE — Op Note (Signed)
[facility] Patient Name: Tonya Schneider Procedure Date: 04/22/2023 MRN: [id number] Attending MD: Shirley Friar , MD, [id number] Date of Birth: [birth year] CSN: [id number] Age: 68 Admit Type: Inpatient Procedure:                Colonoscopy Indications:              Last colonoscopy: November 2017, Generalized                            abdominal pain, Abnormal CT of the GI tract Providers:                Shirley Friar, MD, Jacquelyn "Jaci" Clelia Croft, RN,                            Kandice Robinsons, Technician Referring MD:             hospital team Medicines:                Propofol per Anesthesia, Monitored Anesthesia Care Complications:            No immediate complications. Estimated Blood Loss:     Estimated blood loss was minimal. Procedure:                Pre-Anesthesia Assessment:                           - Prior to the procedure, a History and Physical                            was performed, and patient medications and                            allergies were reviewed. The patient's tolerance of                            previous anesthesia was also reviewed. The risks                            and benefits of the procedure and the sedation                            options and risks were discussed with the patient.                            All questions were answered, and informed consent                            was obtained. Prior Anticoagulants: The patient has                            taken no anticoagulant or antiplatelet agents. ASA                            Grade Assessment: III - A patient with severe  systemic disease. After reviewing the risks and                            benefits, the patient was deemed in satisfactory                            condition to undergo the procedure.                           After obtaining informed consent, the colonoscope                            was passed under  direct vision. Throughout the                            procedure, the patient's blood pressure, pulse, and                            oxygen saturations were monitored continuously. The                            PCF-HQ190L ([id number] Olympus colonoscope was                            introduced through the anus and advanced to the the                            cecum, identified by appendiceal orifice and                            ileocecal valve. The colonoscopy was performed with                            difficulty due to unsatisfactory bowel prep,                            significant looping and a tortuous colon.                            Successful completion of the procedure was aided by                            straightening and shortening the scope to obtain                            bowel loop reduction and lavage. The patient                            tolerated the procedure well. The quality of the                            bowel preparation was fair except the rectum was  unsatisfactory and the descending colon was                            unsatisfactory. The ileocecal valve, appendiceal                            orifice, and rectum were photographed. Scope In: 12:36:40 PM Scope Out: 1:05:26 PM Scope Withdrawal Time: 0 hours 20 minutes 18 seconds  Total Procedure Duration: 0 hours 28 minutes 46 seconds  Findings:      The perianal and digital rectal examinations were normal.      A localized area of severely congested, erythematous, nodular and       ulcerated mucosa was found in the rectum. Biopsies were taken with a       cold forceps for histology. Estimated blood loss was minimal.      A large amount of semi-liquid semi-solid solid stool was found in the       entire colon, interfering with visualization. Lavage of the area was       performed, resulting in incomplete clearance with fair visualization.      Internal hemorrhoids  were found during retroflexion. The hemorrhoids       were small and Grade I (internal hemorrhoids that do not prolapse).      Normal mucosa was found from sigmoid to cecum. Biopsies for histology       were taken with a cold forceps from the entire colon for evaluation of       microscopic colitis. Estimated blood loss was minimal. Impression:               - Congested, erythematous, nodular and ulcerated                            mucosa in the rectum. Biopsied.                           - Stool in the entire examined colon.                           - Internal hemorrhoids.                           - Normal mucosa from sigmoid to cecum. Biopsied. Moderate Sedation:      N/A - MAC procedure Recommendation:           - Advance diet as tolerated and full liquid diet.                           - Await pathology results.                           - Repeat colonoscopy for surveillance based on                            pathology results. Procedure Code(s):        --- Professional ---                           [phone], Colonoscopy, flexible; with biopsy,  single                            or multiple Diagnosis Code(s):        --- Professional ---                           R10.84, Generalized abdominal pain                           R93.3, Abnormal findings on diagnostic imaging of                            other parts of digestive tract                           K62.6, Ulcer of anus and rectum                           K62.89, Other specified diseases of anus and rectum                           K64.0, First degree hemorrhoids CPT copyright 2022 [employer]. All rights reserved. The codes documented in this report are preliminary and upon coder review may  be revised to meet current compliance requirements. Shirley Friar, MD 04/22/2023 1:21:50 PM This report has been signed electronically. Number of Addenda: 0

## 2023-04-22 NOTE — Progress Notes (Signed)
Inpatient Gyn Onc Progress Note  Subjective: Pt reports doing well this am. No nausea or emesis. Reports having bowel movements with the bowel prep. Output at the end of prep was not clear but she feels she is cleaned out. Voiding without difficulty with burning improving. No concerns voiced about incision.   Objective: Vital signs in last 24 hours: Temp:  [97.9 F (36.6 C)-98.9 F (37.2 C)] 98.3 F (36.8 C) (02/12 0505) Pulse Rate:  [88-93] 91 (02/12 0505) Resp:  [15-16] 15 (02/12 0505) BP: (116-141)/(77-95) 136/86 (02/12 0505) SpO2:  [97 %-100 %] 97 % (02/12 0505) Last BM Date : 04/20/23  Intake/Output from previous day: 02/11 0701 - 02/12 0700 In: 1214.8 [P.O.:1080; IV Piggyback:134.8] Out: 865 [Urine:800; Drains:65]  Physical Examination: General: Alert, oriented, no acute distress, sitting in the chair. Getting ready to be taken down to endoscopy holding area. Extremities: Warm, well perfused.  No edema bilaterally.   Labs:   BUN/Cr/glu/ALT/AST/amyl/lip:  7/0.59/--/82/76/--/-- (02/12 0510) CEA: 14.8 (elevated) CA125: 24.6 (normal) Abundant E coli on abscess wall taken intra-op Path has returned from procedure on 04/14/23: A.   UTERUS, CERVIX, BILATERAL FALLOPIAN TUBES AND OVARIES: Cervix     Nabothian cyst     Negative for dysplasia Endometrium     Atrophic     Benign endometrial polyps     Negative for endometrial intraepithelial neoplasia (EIN) and malignancy Myometrium     Unremarkable     Negative for malignancy Right ovary     Benign mucinous cyst, 3 mm     Negative for endometriosis and malignancy Left ovary     Mild inflammation with reactive serosal mesothelial changes and mild serositis     Negative for endometriosis and malignancy Right fallopian tube     Unremarkable     Negative for endometriosis and malignancy Left fallopian tube     Focal mild nonspecific inflammation     Negative for endometriosis and malignancy  B.   PERINEUM CUL DE SAC,  POSTERIOR, EXCISION: Granulation tissue with abundant fibrin and hemorrhage Negative for endometriosis and malignancy   Assessment/Plan: 68 y.o. s/p total abdominal hysterectomy, bilateral salpingo-oophorectomy, drainage of pelvic abscess cavity, drain placement on 04/14/23 with Dr. Clide Cliff. Stable post-operatively. GI consultation completed with plans for colonoscopy inpatient this am. Surgical drain to be removed at discharge. Based on the colonoscopy findings and whether biopsies are obtained etc, plan could be for possible discharge as early as this afternoon or tomorrow. Dr. Nelson Chimes spoke with patient about antibiotics at discharge. Will plan on adjusting the post-op visit with Dr. Alvester Morin by one week given close proximity of appointment from discharge date. Post-operative instructions have been added. Continue current plan of care.      LOS: 12 days    Tonya Schneider 04/22/2023, 7:52 AM

## 2023-04-23 ENCOUNTER — Other Ambulatory Visit: Payer: Self-pay

## 2023-04-23 ENCOUNTER — Telehealth: Payer: Self-pay | Admitting: *Deleted

## 2023-04-23 NOTE — Telephone Encounter (Signed)
Spoke with Tonya Schneider this morning. She states she is eating, drinking and urinating well. She has had a BM and is passing gas. She is taking colace as prescribed and encouraged her to drink plenty of water. She denies fever or chills. Incisions are dry and intact. She rates her pain 3/10. Pt is not taking anything for pain. Relayed to patient that her urine culture is negative. Pt is pleased with results.   Instructed to call office with any fever, chills, purulent drainage, uncontrolled pain or any other questions or concerns. Patient verbalizes understanding.   Pt aware of post op appointments as well as the office number [phone] and after hours number [phone] to call if she has any questions or concerns

## 2023-04-24 ENCOUNTER — Encounter (HOSPITAL_COMMUNITY): Payer: Self-pay | Admitting: Gastroenterology

## 2023-04-24 LAB — SURGICAL PATHOLOGY

## 2023-04-27 ENCOUNTER — Encounter: Payer: Medicare HMO | Admitting: Psychiatry

## 2023-04-28 ENCOUNTER — Telehealth: Payer: Self-pay | Admitting: Oncology

## 2023-04-28 DIAGNOSIS — N739 Female pelvic inflammatory disease, unspecified: Secondary | ICD-10-CM | POA: Diagnosis not present

## 2023-04-28 DIAGNOSIS — Z681 Body mass index (BMI) 19 or less, adult: Secondary | ICD-10-CM | POA: Diagnosis not present

## 2023-04-28 DIAGNOSIS — Z90710 Acquired absence of both cervix and uterus: Secondary | ICD-10-CM | POA: Diagnosis not present

## 2023-04-28 DIAGNOSIS — R1900 Intra-abdominal and pelvic swelling, mass and lump, unspecified site: Secondary | ICD-10-CM | POA: Diagnosis not present

## 2023-04-28 NOTE — Telephone Encounter (Signed)
 Called [facility] and spoke with Dr. Marge Duncans nurse about colonoscopy pathology and if patient has been notified of results.  She is going to check with Dr. Bosie Clos and call back.

## 2023-04-28 NOTE — Telephone Encounter (Signed)
 Melissa with Dr. Marge Duncans office called back and said Dr. Bosie Clos had called Tonya Schneider on Friday and discussed the results of the colonoscopy.  She is now scheduled to see Dr. Dossie Der at [facility] on 05/21/23.

## 2023-05-04 ENCOUNTER — Other Ambulatory Visit: Payer: Self-pay

## 2023-05-04 ENCOUNTER — Inpatient Hospital Stay: Payer: Medicare HMO | Attending: Psychiatry | Admitting: Psychiatry

## 2023-05-04 ENCOUNTER — Encounter: Payer: Self-pay | Admitting: Psychiatry

## 2023-05-04 VITALS — BP 129/74 | HR 97 | Temp 98.8°F | Resp 19 | Wt 126.2 lb

## 2023-05-04 DIAGNOSIS — N739 Female pelvic inflammatory disease, unspecified: Secondary | ICD-10-CM

## 2023-05-04 DIAGNOSIS — Z90710 Acquired absence of both cervix and uterus: Secondary | ICD-10-CM

## 2023-05-04 DIAGNOSIS — Z90722 Acquired absence of ovaries, bilateral: Secondary | ICD-10-CM

## 2023-05-04 DIAGNOSIS — Z9079 Acquired absence of other genital organ(s): Secondary | ICD-10-CM

## 2023-05-04 NOTE — Progress Notes (Signed)
 Gynecologic Oncology Return Clinic Visit  Date of Service: 05/04/2023  Assessment & Plan: Tonya Schneider is a 68 y.o. woman with recent admission with large bowel obstruction with pelvic cystic mass, s/p TAH, BSO, pelvic abscess drainage (in posterior cul-de-sac and extending into the rectovaginal septum) on 04/14/23 and subsequent colonoscopy with rectal biopsy with squamous cell carcinoma.  Postop: - Pt recovering well from surgery and healing appropriately postoperatively - Intraoperative findings and pathology results reviewed. - Ongoing postoperative expectations and precautions reviewed. Continue with no lifting >10lbs through 6 weeks postoperatively. Nothing in the vagina for 10 weeks postop.  Squamous cell carcinoma: - Pt encouraged to keep follow-up with Dr. Cliffton Asters tomorrow - Normal cervix on final pathology. Normal vaginal mucosa on exam.   RTC prn.  Clide Cliff, MD Gynecologic Oncology   ----------------------- Reason for Visit: Postop  Interval History: Pt reports that she is recovering well from surgery. Has minimal pain, not needing pain meds. She is eating and drinking well. She is voiding without issue. Decided to stop miralax a few days ago because her bowels were mostly loose. Since stopped, not more formed yet but less frequent BM. No vaginal bleeding.  Past Medical/Surgical History: No past medical history on file.  Past Surgical History:  Procedure Laterality Date   ABDOMINAL HYSTERECTOMY N/A 04/14/2023   Procedure: TOTAL ABDOMINAL HYSTERECTOMY, BILATERAL SALPINGO OOPHORECTOMY;  Surgeon: Clide Cliff, MD;  Location: [facility];  Service: Gynecology;  Laterality: N/A;   BIOPSY  04/22/2023   Procedure: BIOPSY;  Surgeon: Charlott Rakes, MD;  Location: [facility];  Service: Gastroenterology;;   Fidela Salisbury RELEASE Bilateral    2010   COLONOSCOPY WITH PROPOFOL N/A 04/22/2023   Procedure: COLONOSCOPY WITH PROPOFOL;  Surgeon: Charlott Rakes, MD;   Location: [facility];  Service: Gastroenterology;  Laterality: N/A;   FRACTURE SURGERY Right    Hip, partial hip replacement   INCISION AND DRAINAGE ABSCESS  04/14/2023   Procedure: ABSCESS DRAINAGE AND DRAIN PLACEMENT;  Surgeon: Clide Cliff, MD;  Location: [facility];  Service: Gynecology;;   OTHER SURGICAL HISTORY     Microdisectomy 1998    Family History  Problem Relation Age of Onset   Breast cancer Mother 31   Ovarian cancer Neg Hx    Endometrial cancer Neg Hx    Colon cancer Neg Hx     Social History   Socioeconomic History   Marital status: Married    Spouse name: Not on file   Number of children: Not on file   Years of education: Not on file   Highest education level: Not on file  Occupational History   Not on file  Tobacco Use   Smoking status: Never   Smokeless tobacco: Not on file  Vaping Use   Vaping status: Never Used  Substance and Sexual Activity   Alcohol use: Never   Drug use: Never   Sexual activity: Yes    Partners: Male  Other Topics Concern   Not on file  Social History Narrative   Not on file   Social Drivers of Health   Financial Resource Strain: Not on file  Food Insecurity: No Food Insecurity (04/10/2023)   Hunger Vital Sign    Worried About Running Out of Food in the Last Year: Never true    Ran Out of Food in the Last Year: Never true  Transportation Needs: No Transportation Needs (04/10/2023)   PRAPARE - Administrator, Civil Service (Medical): No    Lack of Transportation (  Non-Medical): No  Physical Activity: Not on file  Stress: Not on file  Social Connections: Moderately Integrated (04/10/2023)   Social Connection and Isolation Panel [NHANES]    Frequency of Communication with Friends and Family: Once a week    Frequency of Social Gatherings with Friends and Family: Once a week    Attends Religious Services: More than 4 times per year    Active Member of [employer] or Organizations: Yes    Attends Hospital doctor: More than 4 times per year    Marital Status: Married    Current Medications:  Current Outpatient Medications:    ciprofloxacin (CIPRO) 500 MG tablet, Take 1 tablet (500 mg total) by mouth 2 (two) times daily for 13 days., Disp: 26 tablet, Rfl: 0   docusate sodium (COLACE) 100 MG capsule, Take 2 capsules (200 mg total) by mouth 2 (two) times daily as needed for mild constipation., Disp: 60 capsule, Rfl: 0   metroNIDAZOLE (FLAGYL) 500 MG tablet, Take 1 tablet (500 mg total) by mouth 2 (two) times daily for 13 days., Disp: 26 tablet, Rfl: 0   polyethylene glycol powder (GLYCOLAX/MIRALAX) 17 GM/SCOOP powder, Take 17 grams dissolved in liquid by mouth daily as needed for severe constipation., Disp: 238 g, Rfl: 0  Review of Symptoms: Complete 10-system review is negative except as above in Interval History.  Physical Exam: BP 129/74 (BP Location: Left Arm, Patient Position: Sitting)   Pulse 97   Temp 98.8 F (37.1 C) (Oral)   Resp 19   Wt 126 lb 3.2 oz (57.2 kg)   SpO2 100%   BMI 18.91 kg/m  General: Alert, oriented, no acute distress. HEENT: Normocephalic, atraumatic. Neck symmetric without masses. Sclera anicteric.  Chest: Normal work of breathing.  Abdomen: Soft, nontender.  Well-healing incision and drain site. Extremities: Grossly normal range of motion.  Warm, well perfused.  No edema bilaterally. Skin: No rashes or lesions noted. GU: Normal appearing external genitalia without erythema, excoriation, or lesions.  Speculum exam reveals intact well healing vaginal cuff. Normal vaginal mucosa.  Bimanual exam reveals intact vaginal cuff. Normal vaginal mucosa without lesion or nodularity but posterior vaginal wall with mild anterior deviation from thickening in rectovaginal septum. Exam chaperoned by Kimberly [place], CMA   Laboratory & Radiologic Studies: Surgical pathology (04/14/23): A.   UTERUS, CERVIX, BILATERAL FALLOPIAN TUBES AND OVARIES: Cervix     Nabothian cyst      Negative for dysplasia Endometrium     Atrophic     Benign endometrial polyps     Negative for endometrial intraepithelial neoplasia (EIN) and malignancy Myometrium     Unremarkable     Negative for malignancy Right ovary     Benign mucinous cyst, 3 mm     Negative for endometriosis and malignancy Left ovary     Mild inflammation with reactive serosal mesothelial changes and mild serositis     Negative for endometriosis and malignancy Right fallopian tube     Unremarkable     Negative for endometriosis and malignancy Left fallopian tube     Focal mild nonspecific inflammation     Negative for endometriosis and malignancy  B.   PERINEUM CUL DE SAC, POSTERIOR, EXCISION: Granulation tissue with abundant fibrin and hemorrhage Negative for endometriosis and malignancy   Surgical pathology (04/22/23) A. COLON, RANDOM, BIOPSY: Benign colonic mucosa with no diagnostic abnormality  B. SIGMOID COLON, BIOPSY: Colonic mucosa with mild architectural distortion Negative for activity, granulomas and dysplasia  C. RECTUM, LESION,  BIOPSY: Poorly differentiated squamous carcinoma (p16 positive) (see comment)  COMMENT:  The rectal lesion (C) shows three fragments of colonic mucosa one of which shows a tumor within the submucosa focally infiltrating into the lamina propria. This tumor is composed of an atypical square moine appearing epithelium with frequent apoptotic bodies. Five immunohistochemical stains were formed with adequate control to further characterize this atypical epithelium. Was positive for the squamous marker P40 and is diffusely and strongly positive for the HPV surrogate marker P-16. The tumor is negative for cytokeratin 7 and cytokeratin 20 and the neuroendocrine marker synaptophysin. The tumor is present in a submucosal location and the immunohistochemistry suggests an anal origin. The patient is status post hysterectomy so a cervix primary is unlikely. A  metastasis cannot be absolutely excluded.

## 2023-05-04 NOTE — Patient Instructions (Signed)
 It was a pleasure to see you in clinic today. - No lifting <10lbs until 6 weeks postop. Okay to resume activities at 6 weeks postop. And nothing in the vagina until 10 weeks postop.    Thank you very much for allowing me to provide care for you today.  I appreciate your confidence in choosing our Gynecologic Oncology team at [facility].  If you have any questions about your visit today please call our office or send [place] a MyChart message and we will get back to you as soon as possible.

## 2023-05-05 DIAGNOSIS — C20 Malignant neoplasm of rectum: Secondary | ICD-10-CM | POA: Diagnosis not present

## 2023-05-06 NOTE — Telephone Encounter (Signed)
 Called and left VM for patient to call back to schedule an appointment.

## 2023-05-06 NOTE — Progress Notes (Signed)
 Received referral from Dr. Marin Olp.  Called and spoke to patient regarding referral.  Patient stated she would like to be referred to [facility] since their campus is closer to where she lives.  Referring physician was made aware of patient's request.

## 2023-05-11 DIAGNOSIS — C20 Malignant neoplasm of rectum: Secondary | ICD-10-CM | POA: Diagnosis not present

## 2023-05-14 DIAGNOSIS — M7989 Other specified soft tissue disorders: Secondary | ICD-10-CM | POA: Diagnosis not present

## 2023-05-14 DIAGNOSIS — C20 Malignant neoplasm of rectum: Secondary | ICD-10-CM | POA: Diagnosis not present

## 2023-05-14 DIAGNOSIS — R1907 Generalized intra-abdominal and pelvic swelling, mass and lump: Secondary | ICD-10-CM | POA: Diagnosis not present

## 2023-05-14 DIAGNOSIS — K6289 Other specified diseases of anus and rectum: Secondary | ICD-10-CM | POA: Diagnosis not present

## 2023-05-21 DIAGNOSIS — R1907 Generalized intra-abdominal and pelvic swelling, mass and lump: Secondary | ICD-10-CM | POA: Diagnosis not present

## 2023-05-21 DIAGNOSIS — M7989 Other specified soft tissue disorders: Secondary | ICD-10-CM | POA: Diagnosis not present

## 2023-05-21 DIAGNOSIS — C20 Malignant neoplasm of rectum: Secondary | ICD-10-CM | POA: Diagnosis not present

## 2023-05-25 DIAGNOSIS — R1907 Generalized intra-abdominal and pelvic swelling, mass and lump: Secondary | ICD-10-CM | POA: Diagnosis not present

## 2023-05-25 DIAGNOSIS — M7989 Other specified soft tissue disorders: Secondary | ICD-10-CM | POA: Diagnosis not present

## 2023-05-25 DIAGNOSIS — C20 Malignant neoplasm of rectum: Secondary | ICD-10-CM | POA: Diagnosis not present

## 2023-05-26 ENCOUNTER — Other Ambulatory Visit (HOSPITAL_COMMUNITY): Payer: Self-pay

## 2023-05-27 DIAGNOSIS — K651 Peritoneal abscess: Secondary | ICD-10-CM | POA: Diagnosis not present

## 2023-05-27 DIAGNOSIS — C20 Malignant neoplasm of rectum: Secondary | ICD-10-CM | POA: Diagnosis not present

## 2023-05-27 DIAGNOSIS — R799 Abnormal finding of blood chemistry, unspecified: Secondary | ICD-10-CM | POA: Diagnosis not present

## 2023-05-27 DIAGNOSIS — Z5111 Encounter for antineoplastic chemotherapy: Secondary | ICD-10-CM | POA: Diagnosis not present

## 2023-05-27 DIAGNOSIS — N739 Female pelvic inflammatory disease, unspecified: Secondary | ICD-10-CM | POA: Diagnosis not present

## 2023-05-28 DIAGNOSIS — C44520 Squamous cell carcinoma of anal skin: Secondary | ICD-10-CM | POA: Diagnosis not present

## 2023-05-28 DIAGNOSIS — K651 Peritoneal abscess: Secondary | ICD-10-CM | POA: Diagnosis not present

## 2023-05-28 DIAGNOSIS — Z4682 Encounter for fitting and adjustment of non-vascular catheter: Secondary | ICD-10-CM | POA: Diagnosis not present

## 2023-05-28 DIAGNOSIS — Z5111 Encounter for antineoplastic chemotherapy: Secondary | ICD-10-CM | POA: Diagnosis not present

## 2023-05-28 DIAGNOSIS — R799 Abnormal finding of blood chemistry, unspecified: Secondary | ICD-10-CM | POA: Diagnosis not present

## 2023-05-28 DIAGNOSIS — C20 Malignant neoplasm of rectum: Secondary | ICD-10-CM | POA: Diagnosis not present

## 2023-06-01 DIAGNOSIS — M7989 Other specified soft tissue disorders: Secondary | ICD-10-CM | POA: Diagnosis not present

## 2023-06-01 DIAGNOSIS — R1907 Generalized intra-abdominal and pelvic swelling, mass and lump: Secondary | ICD-10-CM | POA: Diagnosis not present

## 2023-06-01 DIAGNOSIS — C20 Malignant neoplasm of rectum: Secondary | ICD-10-CM | POA: Diagnosis not present

## 2023-06-02 DIAGNOSIS — C20 Malignant neoplasm of rectum: Secondary | ICD-10-CM | POA: Diagnosis not present

## 2023-06-02 DIAGNOSIS — M7989 Other specified soft tissue disorders: Secondary | ICD-10-CM | POA: Diagnosis not present

## 2023-06-02 DIAGNOSIS — R1907 Generalized intra-abdominal and pelvic swelling, mass and lump: Secondary | ICD-10-CM | POA: Diagnosis not present

## 2023-06-08 DIAGNOSIS — C20 Malignant neoplasm of rectum: Secondary | ICD-10-CM | POA: Diagnosis not present

## 2023-06-08 DIAGNOSIS — R1907 Generalized intra-abdominal and pelvic swelling, mass and lump: Secondary | ICD-10-CM | POA: Diagnosis not present

## 2023-06-08 DIAGNOSIS — M7989 Other specified soft tissue disorders: Secondary | ICD-10-CM | POA: Diagnosis not present

## 2023-06-10 DIAGNOSIS — C20 Malignant neoplasm of rectum: Secondary | ICD-10-CM | POA: Diagnosis not present

## 2023-06-10 DIAGNOSIS — Z79631 Long term (current) use of antimetabolite agent: Secondary | ICD-10-CM | POA: Diagnosis not present

## 2023-06-10 DIAGNOSIS — Z510 Encounter for antineoplastic radiation therapy: Secondary | ICD-10-CM | POA: Diagnosis not present

## 2023-06-10 DIAGNOSIS — Z79632 Long term (current) use of antitumor antibiotic: Secondary | ICD-10-CM | POA: Diagnosis not present

## 2023-06-15 DIAGNOSIS — Z510 Encounter for antineoplastic radiation therapy: Secondary | ICD-10-CM | POA: Diagnosis not present

## 2023-06-15 DIAGNOSIS — D6481 Anemia due to antineoplastic chemotherapy: Secondary | ICD-10-CM | POA: Diagnosis not present

## 2023-06-15 DIAGNOSIS — C20 Malignant neoplasm of rectum: Secondary | ICD-10-CM | POA: Diagnosis not present

## 2023-06-15 DIAGNOSIS — Z860100 Personal history of colon polyps, unspecified: Secondary | ICD-10-CM | POA: Diagnosis not present

## 2023-06-15 DIAGNOSIS — T451X5A Adverse effect of antineoplastic and immunosuppressive drugs, initial encounter: Secondary | ICD-10-CM | POA: Diagnosis not present

## 2023-06-15 DIAGNOSIS — Z5111 Encounter for antineoplastic chemotherapy: Secondary | ICD-10-CM | POA: Diagnosis not present

## 2023-06-15 DIAGNOSIS — C44520 Squamous cell carcinoma of anal skin: Secondary | ICD-10-CM | POA: Diagnosis not present

## 2023-06-15 DIAGNOSIS — Z85828 Personal history of other malignant neoplasm of skin: Secondary | ICD-10-CM | POA: Diagnosis not present

## 2023-06-16 DIAGNOSIS — Z5111 Encounter for antineoplastic chemotherapy: Secondary | ICD-10-CM | POA: Diagnosis not present

## 2023-06-16 DIAGNOSIS — Z510 Encounter for antineoplastic radiation therapy: Secondary | ICD-10-CM | POA: Diagnosis not present

## 2023-06-16 DIAGNOSIS — C20 Malignant neoplasm of rectum: Secondary | ICD-10-CM | POA: Diagnosis not present

## 2023-06-16 DIAGNOSIS — C44520 Squamous cell carcinoma of anal skin: Secondary | ICD-10-CM | POA: Diagnosis not present

## 2023-06-17 DIAGNOSIS — C20 Malignant neoplasm of rectum: Secondary | ICD-10-CM | POA: Diagnosis not present

## 2023-06-17 DIAGNOSIS — Z510 Encounter for antineoplastic radiation therapy: Secondary | ICD-10-CM | POA: Diagnosis not present

## 2023-06-18 DIAGNOSIS — C20 Malignant neoplasm of rectum: Secondary | ICD-10-CM | POA: Diagnosis not present

## 2023-06-18 DIAGNOSIS — Z510 Encounter for antineoplastic radiation therapy: Secondary | ICD-10-CM | POA: Diagnosis not present

## 2023-06-19 DIAGNOSIS — Z510 Encounter for antineoplastic radiation therapy: Secondary | ICD-10-CM | POA: Diagnosis not present

## 2023-06-19 DIAGNOSIS — C20 Malignant neoplasm of rectum: Secondary | ICD-10-CM | POA: Diagnosis not present

## 2023-06-22 DIAGNOSIS — Z510 Encounter for antineoplastic radiation therapy: Secondary | ICD-10-CM | POA: Diagnosis not present

## 2023-06-22 DIAGNOSIS — Z79631 Long term (current) use of antimetabolite agent: Secondary | ICD-10-CM | POA: Diagnosis not present

## 2023-06-22 DIAGNOSIS — Z79632 Long term (current) use of antitumor antibiotic: Secondary | ICD-10-CM | POA: Diagnosis not present

## 2023-06-22 DIAGNOSIS — C20 Malignant neoplasm of rectum: Secondary | ICD-10-CM | POA: Diagnosis not present

## 2023-06-23 DIAGNOSIS — Z79631 Long term (current) use of antimetabolite agent: Secondary | ICD-10-CM | POA: Diagnosis not present

## 2023-06-23 DIAGNOSIS — C20 Malignant neoplasm of rectum: Secondary | ICD-10-CM | POA: Diagnosis not present

## 2023-06-23 DIAGNOSIS — Z79632 Long term (current) use of antitumor antibiotic: Secondary | ICD-10-CM | POA: Diagnosis not present

## 2023-06-23 DIAGNOSIS — Z510 Encounter for antineoplastic radiation therapy: Secondary | ICD-10-CM | POA: Diagnosis not present

## 2023-06-24 DIAGNOSIS — Z79631 Long term (current) use of antimetabolite agent: Secondary | ICD-10-CM | POA: Diagnosis not present

## 2023-06-24 DIAGNOSIS — C20 Malignant neoplasm of rectum: Secondary | ICD-10-CM | POA: Diagnosis not present

## 2023-06-24 DIAGNOSIS — Z510 Encounter for antineoplastic radiation therapy: Secondary | ICD-10-CM | POA: Diagnosis not present

## 2023-06-24 DIAGNOSIS — Z79632 Long term (current) use of antitumor antibiotic: Secondary | ICD-10-CM | POA: Diagnosis not present

## 2023-06-25 DIAGNOSIS — Z510 Encounter for antineoplastic radiation therapy: Secondary | ICD-10-CM | POA: Diagnosis not present

## 2023-06-25 DIAGNOSIS — Z79632 Long term (current) use of antitumor antibiotic: Secondary | ICD-10-CM | POA: Diagnosis not present

## 2023-06-25 DIAGNOSIS — C20 Malignant neoplasm of rectum: Secondary | ICD-10-CM | POA: Diagnosis not present

## 2023-06-25 DIAGNOSIS — Z79631 Long term (current) use of antimetabolite agent: Secondary | ICD-10-CM | POA: Diagnosis not present

## 2023-06-26 DIAGNOSIS — Z79632 Long term (current) use of antitumor antibiotic: Secondary | ICD-10-CM | POA: Diagnosis not present

## 2023-06-26 DIAGNOSIS — Z79631 Long term (current) use of antimetabolite agent: Secondary | ICD-10-CM | POA: Diagnosis not present

## 2023-06-26 DIAGNOSIS — Z510 Encounter for antineoplastic radiation therapy: Secondary | ICD-10-CM | POA: Diagnosis not present

## 2023-06-26 DIAGNOSIS — C20 Malignant neoplasm of rectum: Secondary | ICD-10-CM | POA: Diagnosis not present

## 2023-06-29 DIAGNOSIS — Z510 Encounter for antineoplastic radiation therapy: Secondary | ICD-10-CM | POA: Diagnosis not present

## 2023-06-29 DIAGNOSIS — C20 Malignant neoplasm of rectum: Secondary | ICD-10-CM | POA: Diagnosis not present

## 2023-06-29 DIAGNOSIS — Z79632 Long term (current) use of antitumor antibiotic: Secondary | ICD-10-CM | POA: Diagnosis not present

## 2023-06-29 DIAGNOSIS — Z79631 Long term (current) use of antimetabolite agent: Secondary | ICD-10-CM | POA: Diagnosis not present

## 2023-06-30 DIAGNOSIS — Z79632 Long term (current) use of antitumor antibiotic: Secondary | ICD-10-CM | POA: Diagnosis not present

## 2023-06-30 DIAGNOSIS — Z79631 Long term (current) use of antimetabolite agent: Secondary | ICD-10-CM | POA: Diagnosis not present

## 2023-06-30 DIAGNOSIS — Z510 Encounter for antineoplastic radiation therapy: Secondary | ICD-10-CM | POA: Diagnosis not present

## 2023-06-30 DIAGNOSIS — C20 Malignant neoplasm of rectum: Secondary | ICD-10-CM | POA: Diagnosis not present

## 2023-07-01 DIAGNOSIS — Z79632 Long term (current) use of antitumor antibiotic: Secondary | ICD-10-CM | POA: Diagnosis not present

## 2023-07-01 DIAGNOSIS — Z510 Encounter for antineoplastic radiation therapy: Secondary | ICD-10-CM | POA: Diagnosis not present

## 2023-07-01 DIAGNOSIS — C20 Malignant neoplasm of rectum: Secondary | ICD-10-CM | POA: Diagnosis not present

## 2023-07-01 DIAGNOSIS — Z79631 Long term (current) use of antimetabolite agent: Secondary | ICD-10-CM | POA: Diagnosis not present

## 2023-07-02 DIAGNOSIS — Z79632 Long term (current) use of antitumor antibiotic: Secondary | ICD-10-CM | POA: Diagnosis not present

## 2023-07-02 DIAGNOSIS — Z510 Encounter for antineoplastic radiation therapy: Secondary | ICD-10-CM | POA: Diagnosis not present

## 2023-07-02 DIAGNOSIS — Z79631 Long term (current) use of antimetabolite agent: Secondary | ICD-10-CM | POA: Diagnosis not present

## 2023-07-02 DIAGNOSIS — C20 Malignant neoplasm of rectum: Secondary | ICD-10-CM | POA: Diagnosis not present

## 2023-07-03 DIAGNOSIS — C20 Malignant neoplasm of rectum: Secondary | ICD-10-CM | POA: Diagnosis not present

## 2023-07-03 DIAGNOSIS — Z79632 Long term (current) use of antitumor antibiotic: Secondary | ICD-10-CM | POA: Diagnosis not present

## 2023-07-03 DIAGNOSIS — Z510 Encounter for antineoplastic radiation therapy: Secondary | ICD-10-CM | POA: Diagnosis not present

## 2023-07-03 DIAGNOSIS — Z79631 Long term (current) use of antimetabolite agent: Secondary | ICD-10-CM | POA: Diagnosis not present

## 2023-07-06 DIAGNOSIS — Z79632 Long term (current) use of antitumor antibiotic: Secondary | ICD-10-CM | POA: Diagnosis not present

## 2023-07-06 DIAGNOSIS — C20 Malignant neoplasm of rectum: Secondary | ICD-10-CM | POA: Diagnosis not present

## 2023-07-06 DIAGNOSIS — Z79631 Long term (current) use of antimetabolite agent: Secondary | ICD-10-CM | POA: Diagnosis not present

## 2023-07-06 DIAGNOSIS — Z510 Encounter for antineoplastic radiation therapy: Secondary | ICD-10-CM | POA: Diagnosis not present

## 2023-07-07 DIAGNOSIS — Z510 Encounter for antineoplastic radiation therapy: Secondary | ICD-10-CM | POA: Diagnosis not present

## 2023-07-07 DIAGNOSIS — Z79632 Long term (current) use of antitumor antibiotic: Secondary | ICD-10-CM | POA: Diagnosis not present

## 2023-07-07 DIAGNOSIS — Z79631 Long term (current) use of antimetabolite agent: Secondary | ICD-10-CM | POA: Diagnosis not present

## 2023-07-07 DIAGNOSIS — C20 Malignant neoplasm of rectum: Secondary | ICD-10-CM | POA: Diagnosis not present

## 2023-07-08 DIAGNOSIS — Z79632 Long term (current) use of antitumor antibiotic: Secondary | ICD-10-CM | POA: Diagnosis not present

## 2023-07-08 DIAGNOSIS — Z510 Encounter for antineoplastic radiation therapy: Secondary | ICD-10-CM | POA: Diagnosis not present

## 2023-07-08 DIAGNOSIS — C20 Malignant neoplasm of rectum: Secondary | ICD-10-CM | POA: Diagnosis not present

## 2023-07-08 DIAGNOSIS — Z79631 Long term (current) use of antimetabolite agent: Secondary | ICD-10-CM | POA: Diagnosis not present

## 2023-07-09 DIAGNOSIS — Z510 Encounter for antineoplastic radiation therapy: Secondary | ICD-10-CM | POA: Diagnosis not present

## 2023-07-09 DIAGNOSIS — C20 Malignant neoplasm of rectum: Secondary | ICD-10-CM | POA: Diagnosis not present

## 2023-07-10 DIAGNOSIS — C20 Malignant neoplasm of rectum: Secondary | ICD-10-CM | POA: Diagnosis not present

## 2023-07-10 DIAGNOSIS — Z510 Encounter for antineoplastic radiation therapy: Secondary | ICD-10-CM | POA: Diagnosis not present

## 2023-07-13 DIAGNOSIS — Z510 Encounter for antineoplastic radiation therapy: Secondary | ICD-10-CM | POA: Diagnosis not present

## 2023-07-13 DIAGNOSIS — T451X5A Adverse effect of antineoplastic and immunosuppressive drugs, initial encounter: Secondary | ICD-10-CM | POA: Diagnosis not present

## 2023-07-13 DIAGNOSIS — Z5111 Encounter for antineoplastic chemotherapy: Secondary | ICD-10-CM | POA: Diagnosis not present

## 2023-07-13 DIAGNOSIS — C44520 Squamous cell carcinoma of anal skin: Secondary | ICD-10-CM | POA: Diagnosis not present

## 2023-07-13 DIAGNOSIS — C20 Malignant neoplasm of rectum: Secondary | ICD-10-CM | POA: Diagnosis not present

## 2023-07-13 DIAGNOSIS — D630 Anemia in neoplastic disease: Secondary | ICD-10-CM | POA: Diagnosis not present

## 2023-07-13 DIAGNOSIS — Z85828 Personal history of other malignant neoplasm of skin: Secondary | ICD-10-CM | POA: Diagnosis not present

## 2023-07-13 DIAGNOSIS — N3040 Irradiation cystitis without hematuria: Secondary | ICD-10-CM | POA: Diagnosis not present

## 2023-07-13 DIAGNOSIS — D6481 Anemia due to antineoplastic chemotherapy: Secondary | ICD-10-CM | POA: Diagnosis not present

## 2023-07-13 DIAGNOSIS — Z96641 Presence of right artificial hip joint: Secondary | ICD-10-CM | POA: Diagnosis not present

## 2023-07-14 DIAGNOSIS — C20 Malignant neoplasm of rectum: Secondary | ICD-10-CM | POA: Diagnosis not present

## 2023-07-14 DIAGNOSIS — C44520 Squamous cell carcinoma of anal skin: Secondary | ICD-10-CM | POA: Diagnosis not present

## 2023-07-14 DIAGNOSIS — Z510 Encounter for antineoplastic radiation therapy: Secondary | ICD-10-CM | POA: Diagnosis not present

## 2023-07-14 DIAGNOSIS — Z5111 Encounter for antineoplastic chemotherapy: Secondary | ICD-10-CM | POA: Diagnosis not present

## 2023-07-15 DIAGNOSIS — Z510 Encounter for antineoplastic radiation therapy: Secondary | ICD-10-CM | POA: Diagnosis not present

## 2023-07-15 DIAGNOSIS — C44520 Squamous cell carcinoma of anal skin: Secondary | ICD-10-CM | POA: Diagnosis not present

## 2023-07-15 DIAGNOSIS — C20 Malignant neoplasm of rectum: Secondary | ICD-10-CM | POA: Diagnosis not present

## 2023-07-15 DIAGNOSIS — Z5111 Encounter for antineoplastic chemotherapy: Secondary | ICD-10-CM | POA: Diagnosis not present

## 2023-07-16 DIAGNOSIS — Z510 Encounter for antineoplastic radiation therapy: Secondary | ICD-10-CM | POA: Diagnosis not present

## 2023-07-16 DIAGNOSIS — C20 Malignant neoplasm of rectum: Secondary | ICD-10-CM | POA: Diagnosis not present

## 2023-07-17 DIAGNOSIS — Z510 Encounter for antineoplastic radiation therapy: Secondary | ICD-10-CM | POA: Diagnosis not present

## 2023-07-17 DIAGNOSIS — C20 Malignant neoplasm of rectum: Secondary | ICD-10-CM | POA: Diagnosis not present

## 2023-07-20 DIAGNOSIS — Z510 Encounter for antineoplastic radiation therapy: Secondary | ICD-10-CM | POA: Diagnosis not present

## 2023-07-20 DIAGNOSIS — C20 Malignant neoplasm of rectum: Secondary | ICD-10-CM | POA: Diagnosis not present

## 2023-07-21 DIAGNOSIS — C20 Malignant neoplasm of rectum: Secondary | ICD-10-CM | POA: Diagnosis not present

## 2023-07-21 DIAGNOSIS — Z510 Encounter for antineoplastic radiation therapy: Secondary | ICD-10-CM | POA: Diagnosis not present

## 2023-07-22 DIAGNOSIS — Z510 Encounter for antineoplastic radiation therapy: Secondary | ICD-10-CM | POA: Diagnosis not present

## 2023-07-22 DIAGNOSIS — C20 Malignant neoplasm of rectum: Secondary | ICD-10-CM | POA: Diagnosis not present

## 2023-07-23 DIAGNOSIS — Z510 Encounter for antineoplastic radiation therapy: Secondary | ICD-10-CM | POA: Diagnosis not present

## 2023-07-23 DIAGNOSIS — C20 Malignant neoplasm of rectum: Secondary | ICD-10-CM | POA: Diagnosis not present

## 2023-07-24 DIAGNOSIS — Z510 Encounter for antineoplastic radiation therapy: Secondary | ICD-10-CM | POA: Diagnosis not present

## 2023-07-24 DIAGNOSIS — C20 Malignant neoplasm of rectum: Secondary | ICD-10-CM | POA: Diagnosis not present

## 2023-08-06 DIAGNOSIS — C20 Malignant neoplasm of rectum: Secondary | ICD-10-CM | POA: Diagnosis not present

## 2023-08-11 DIAGNOSIS — L821 Other seborrheic keratosis: Secondary | ICD-10-CM | POA: Diagnosis not present

## 2023-08-11 DIAGNOSIS — Z85828 Personal history of other malignant neoplasm of skin: Secondary | ICD-10-CM | POA: Diagnosis not present

## 2023-08-11 DIAGNOSIS — Z129 Encounter for screening for malignant neoplasm, site unspecified: Secondary | ICD-10-CM | POA: Diagnosis not present

## 2023-08-12 DIAGNOSIS — Z5111 Encounter for antineoplastic chemotherapy: Secondary | ICD-10-CM | POA: Diagnosis not present

## 2023-08-12 DIAGNOSIS — K6289 Other specified diseases of anus and rectum: Secondary | ICD-10-CM | POA: Diagnosis not present

## 2023-08-12 DIAGNOSIS — C20 Malignant neoplasm of rectum: Secondary | ICD-10-CM | POA: Diagnosis not present

## 2023-08-25 DIAGNOSIS — K644 Residual hemorrhoidal skin tags: Secondary | ICD-10-CM | POA: Diagnosis not present

## 2023-08-25 DIAGNOSIS — C20 Malignant neoplasm of rectum: Secondary | ICD-10-CM | POA: Diagnosis not present

## 2023-08-25 DIAGNOSIS — Z923 Personal history of irradiation: Secondary | ICD-10-CM | POA: Diagnosis not present

## 2023-09-07 DIAGNOSIS — M810 Age-related osteoporosis without current pathological fracture: Secondary | ICD-10-CM | POA: Diagnosis not present

## 2023-09-22 ENCOUNTER — Ambulatory Visit: Payer: Medicare HMO

## 2023-09-22 ENCOUNTER — Other Ambulatory Visit: Payer: Medicare HMO

## 2023-09-23 ENCOUNTER — Ambulatory Visit

## 2023-09-23 DIAGNOSIS — M810 Age-related osteoporosis without current pathological fracture: Secondary | ICD-10-CM

## 2023-09-25 ENCOUNTER — Ambulatory Visit
Admission: RE | Admit: 2023-09-25 | Discharge: 2023-09-25 | Disposition: A | Discharge status: Home / Self Care | Source: Ambulatory Visit | Attending: Family Medicine | Admitting: Family Medicine

## 2023-09-25 DIAGNOSIS — Z1231 Encounter for screening mammogram for malignant neoplasm of breast: Secondary | ICD-10-CM

## 2023-10-01 DIAGNOSIS — C210 Malignant neoplasm of anus, unspecified: Secondary | ICD-10-CM | POA: Diagnosis not present

## 2023-10-08 DIAGNOSIS — M810 Age-related osteoporosis without current pathological fracture: Secondary | ICD-10-CM | POA: Diagnosis not present

## 2023-10-20 DIAGNOSIS — C210 Malignant neoplasm of anus, unspecified: Secondary | ICD-10-CM | POA: Diagnosis not present

## 2023-10-20 DIAGNOSIS — C218 Malignant neoplasm of overlapping sites of rectum, anus and anal canal: Secondary | ICD-10-CM | POA: Diagnosis not present

## 2023-10-20 DIAGNOSIS — C20 Malignant neoplasm of rectum: Secondary | ICD-10-CM | POA: Diagnosis not present

## 2023-10-20 DIAGNOSIS — K6289 Other specified diseases of anus and rectum: Secondary | ICD-10-CM | POA: Diagnosis not present

## 2023-10-24 DIAGNOSIS — I498 Other specified cardiac arrhythmias: Secondary | ICD-10-CM | POA: Diagnosis not present

## 2023-11-08 DIAGNOSIS — M810 Age-related osteoporosis without current pathological fracture: Secondary | ICD-10-CM | POA: Diagnosis not present

## 2023-11-16 DIAGNOSIS — C20 Malignant neoplasm of rectum: Secondary | ICD-10-CM | POA: Diagnosis not present

## 2023-11-16 DIAGNOSIS — K6289 Other specified diseases of anus and rectum: Secondary | ICD-10-CM | POA: Diagnosis not present

## 2023-11-16 DIAGNOSIS — C259 Malignant neoplasm of pancreas, unspecified: Secondary | ICD-10-CM | POA: Diagnosis not present

## 2023-11-16 DIAGNOSIS — Z96641 Presence of right artificial hip joint: Secondary | ICD-10-CM | POA: Diagnosis not present

## 2023-11-16 DIAGNOSIS — J9811 Atelectasis: Secondary | ICD-10-CM | POA: Diagnosis not present

## 2023-11-16 DIAGNOSIS — J984 Other disorders of lung: Secondary | ICD-10-CM | POA: Diagnosis not present

## 2023-11-16 DIAGNOSIS — M2570 Osteophyte, unspecified joint: Secondary | ICD-10-CM | POA: Diagnosis not present

## 2023-11-16 DIAGNOSIS — M4856XA Collapsed vertebra, not elsewhere classified, lumbar region, initial encounter for fracture: Secondary | ICD-10-CM | POA: Diagnosis not present

## 2023-11-18 DIAGNOSIS — Z08 Encounter for follow-up examination after completed treatment for malignant neoplasm: Secondary | ICD-10-CM | POA: Diagnosis not present

## 2023-11-18 DIAGNOSIS — K6289 Other specified diseases of anus and rectum: Secondary | ICD-10-CM | POA: Diagnosis not present

## 2023-11-18 DIAGNOSIS — C210 Malignant neoplasm of anus, unspecified: Secondary | ICD-10-CM | POA: Diagnosis not present

## 2023-11-18 DIAGNOSIS — Z5111 Encounter for antineoplastic chemotherapy: Secondary | ICD-10-CM | POA: Diagnosis not present

## 2023-11-18 DIAGNOSIS — Z923 Personal history of irradiation: Secondary | ICD-10-CM | POA: Diagnosis not present

## 2023-11-18 DIAGNOSIS — C20 Malignant neoplasm of rectum: Secondary | ICD-10-CM | POA: Diagnosis not present

## 2023-11-18 DIAGNOSIS — Z85048 Personal history of other malignant neoplasm of rectum, rectosigmoid junction, and anus: Secondary | ICD-10-CM | POA: Diagnosis not present

## 2023-11-18 DIAGNOSIS — Z9889 Other specified postprocedural states: Secondary | ICD-10-CM | POA: Diagnosis not present

## 2023-11-19 DIAGNOSIS — C20 Malignant neoplasm of rectum: Secondary | ICD-10-CM | POA: Diagnosis not present

## 2023-12-02 DIAGNOSIS — Z23 Encounter for immunization: Secondary | ICD-10-CM | POA: Diagnosis not present

## 2023-12-08 DIAGNOSIS — K626 Ulcer of anus and rectum: Secondary | ICD-10-CM | POA: Diagnosis not present

## 2023-12-08 DIAGNOSIS — C20 Malignant neoplasm of rectum: Secondary | ICD-10-CM | POA: Diagnosis not present

## 2023-12-08 DIAGNOSIS — K6389 Other specified diseases of intestine: Secondary | ICD-10-CM | POA: Diagnosis not present

## 2023-12-08 DIAGNOSIS — M810 Age-related osteoporosis without current pathological fracture: Secondary | ICD-10-CM | POA: Diagnosis not present

## 2024-01-05 DIAGNOSIS — M5442 Lumbago with sciatica, left side: Secondary | ICD-10-CM | POA: Diagnosis not present

## 2024-01-05 DIAGNOSIS — M4316 Spondylolisthesis, lumbar region: Secondary | ICD-10-CM | POA: Diagnosis not present

## 2024-01-05 DIAGNOSIS — M48061 Spinal stenosis, lumbar region without neurogenic claudication: Secondary | ICD-10-CM | POA: Diagnosis not present

## 2024-01-08 DIAGNOSIS — M810 Age-related osteoporosis without current pathological fracture: Secondary | ICD-10-CM | POA: Diagnosis not present

## 2024-01-20 DIAGNOSIS — M533 Sacrococcygeal disorders, not elsewhere classified: Secondary | ICD-10-CM | POA: Diagnosis not present

## 2024-02-07 DIAGNOSIS — M810 Age-related osteoporosis without current pathological fracture: Secondary | ICD-10-CM | POA: Diagnosis not present

## 2024-02-15 DIAGNOSIS — C20 Malignant neoplasm of rectum: Secondary | ICD-10-CM | POA: Diagnosis not present

## 2024-02-15 DIAGNOSIS — R590 Localized enlarged lymph nodes: Secondary | ICD-10-CM | POA: Diagnosis not present

## 2024-02-15 DIAGNOSIS — C210 Malignant neoplasm of anus, unspecified: Secondary | ICD-10-CM | POA: Diagnosis not present

## 2024-02-17 DIAGNOSIS — C20 Malignant neoplasm of rectum: Secondary | ICD-10-CM | POA: Diagnosis not present

## 2024-02-17 DIAGNOSIS — Z5111 Encounter for antineoplastic chemotherapy: Secondary | ICD-10-CM | POA: Diagnosis not present
